# Patient Record
Sex: Female | Born: 1975 | Race: White | Hispanic: No | Marital: Married | Smoking: Never smoker
Health system: Southern US, Community
[De-identification: ages and names within clinical notes are randomized; demographics above are authoritative.]

## PROBLEM LIST (undated history)

## (undated) DIAGNOSIS — R000 Tachycardia, unspecified: Secondary | ICD-10-CM

---

## 1997-05-11 ENCOUNTER — Other Ambulatory Visit: Admission: RE | Admit: 1997-05-11 | Discharge: 1997-05-11 | Payer: Self-pay | Admitting: *Deleted

## 1997-11-28 ENCOUNTER — Other Ambulatory Visit: Admission: RE | Admit: 1997-11-28 | Discharge: 1997-11-28 | Payer: Self-pay | Admitting: *Deleted

## 1997-12-13 ENCOUNTER — Ambulatory Visit (HOSPITAL_COMMUNITY): Admission: RE | Admit: 1997-12-13 | Discharge: 1997-12-13 | Payer: Self-pay | Admitting: General Surgery

## 1999-04-09 ENCOUNTER — Other Ambulatory Visit: Admission: RE | Admit: 1999-04-09 | Discharge: 1999-04-09 | Payer: Self-pay | Admitting: *Deleted

## 2000-05-05 ENCOUNTER — Other Ambulatory Visit: Admission: RE | Admit: 2000-05-05 | Discharge: 2000-05-05 | Payer: Self-pay | Admitting: *Deleted

## 2001-05-05 ENCOUNTER — Other Ambulatory Visit: Admission: RE | Admit: 2001-05-05 | Discharge: 2001-05-05 | Payer: Self-pay | Admitting: *Deleted

## 2002-05-05 ENCOUNTER — Other Ambulatory Visit: Admission: RE | Admit: 2002-05-05 | Discharge: 2002-05-05 | Payer: Self-pay | Admitting: *Deleted

## 2003-05-05 ENCOUNTER — Other Ambulatory Visit: Admission: RE | Admit: 2003-05-05 | Discharge: 2003-05-05 | Payer: Self-pay | Admitting: *Deleted

## 2004-05-08 ENCOUNTER — Other Ambulatory Visit: Admission: RE | Admit: 2004-05-08 | Discharge: 2004-05-08 | Payer: Self-pay | Admitting: *Deleted

## 2004-07-17 ENCOUNTER — Other Ambulatory Visit: Admission: RE | Admit: 2004-07-17 | Discharge: 2004-07-17 | Payer: Self-pay | Admitting: *Deleted

## 2004-12-13 ENCOUNTER — Other Ambulatory Visit: Admission: RE | Admit: 2004-12-13 | Discharge: 2004-12-13 | Payer: Self-pay | Admitting: *Deleted

## 2005-05-08 ENCOUNTER — Encounter: Payer: Self-pay | Admitting: General Surgery

## 2005-07-17 ENCOUNTER — Other Ambulatory Visit: Admission: RE | Admit: 2005-07-17 | Discharge: 2005-07-17 | Payer: Self-pay | Admitting: *Deleted

## 2006-07-28 ENCOUNTER — Other Ambulatory Visit: Admission: RE | Admit: 2006-07-28 | Discharge: 2006-07-28 | Payer: Self-pay | Admitting: *Deleted

## 2006-12-26 ENCOUNTER — Ambulatory Visit (HOSPITAL_COMMUNITY): Admission: RE | Admit: 2006-12-26 | Discharge: 2006-12-26 | Payer: Self-pay | Admitting: Family Medicine

## 2010-05-22 NOTE — Procedures (Signed)
[name]                   ACCOUNT NO.:  [id number]   MEDICAL RECORD NO.:  [id number]          PATIENT TYPE:  OUT   LOCATION:  RESP                          FACILITY:  [facility]   PHYSICIAN:  Madaline Savage, M.D.DATE OF BIRTH:  [birth year]   DATE OF PROCEDURE:  DATE OF DISCHARGE:                                ECHOCARDIOGRAM   DESCRIPTION:  The patient is a 35 year old female with a heart murmur  and the clinical information submitted is new LSB.  No old study  available for comparison.  Technical aspects of the study are adequate.   RESULTS:  Aortic valve:  The aortic valve appears to be trileaflet.  It  shows no evidence of stenosis and no regurgitation is noted.  Mitral valve:  The mitral valve subvalvular apparatus appears normal.  The leaflet motion is normal, leaflet thickness is normal, and I see no  evidence of regurgitation.  Tricuspid valve very poorly seen.  No  obvious tricuspid regurgitation noted.  Pulmonic valve not seen.  Aorta:  Normal aortic root dimension of 3.8.  Left ventricle:  End-diastolic dimension normal at 3.4, end-systolic  dimension 3.1, interventricular septum measures 1.1, posterior wall  measurement is 1.0, left ventricular ejection fraction estimate is  roughly 60%.  Pericardium:  No effusion seen.   IMPRESSION:  This is basically a normal echocardiogram with no evidence  of any specific valvular abnormalities.           ______________________________  Madaline Savage, M.D.     WHG/MEDQ  D:  12/26/2006  T:  12/27/2006  Job:  [id number]   cc:   Corrie Mckusick, M.D.  Fax: [phone]   [facility]

## 2012-02-25 ENCOUNTER — Inpatient Hospital Stay (HOSPITAL_COMMUNITY)
Admission: EM | Admit: 2012-02-25 | Discharge: 2012-02-26 | DRG: 310 | Disposition: A | Discharge status: Home / Self Care | Payer: MEDICAID | Attending: Internal Medicine | Admitting: Internal Medicine

## 2012-02-25 ENCOUNTER — Encounter (HOSPITAL_COMMUNITY): Payer: Self-pay

## 2012-02-25 DIAGNOSIS — R55 Syncope and collapse: Secondary | ICD-10-CM | POA: Diagnosis present

## 2012-02-25 DIAGNOSIS — E559 Vitamin D deficiency, unspecified: Secondary | ICD-10-CM | POA: Diagnosis present

## 2012-02-25 DIAGNOSIS — I498 Other specified cardiac arrhythmias: Principal | ICD-10-CM

## 2012-02-25 DIAGNOSIS — R000 Tachycardia, unspecified: Secondary | ICD-10-CM

## 2012-02-25 HISTORY — DX: Tachycardia, unspecified: R00.0

## 2012-02-25 LAB — CBC WITH DIFFERENTIAL/PLATELET
Basophils Absolute: 0 10*3/uL (ref 0.0–0.1)
Basophils Relative: 0 % (ref 0–1)
Eosinophils Absolute: 0 10*3/uL (ref 0.0–0.7)
Eosinophils Relative: 0 % (ref 0–5)
HCT: 44.5 % (ref 36.0–46.0)
Hemoglobin: 15.4 g/dL — ABNORMAL HIGH (ref 12.0–15.0)
Lymphocytes Relative: 18 % (ref 12–46)
Lymphs Abs: 1.6 10*3/uL (ref 0.7–4.0)
MCH: 32 pg (ref 26.0–34.0)
MCHC: 34.6 g/dL (ref 30.0–36.0)
MCV: 92.5 fL (ref 78.0–100.0)
Monocytes Absolute: 0.3 10*3/uL (ref 0.1–1.0)
Monocytes Relative: 4 % (ref 3–12)
Neutro Abs: 7.3 10*3/uL (ref 1.7–7.7)
Neutrophils Relative %: 79 % — ABNORMAL HIGH (ref 43–77)
Platelets: 322 10*3/uL (ref 150–400)
RBC: 4.81 MIL/uL (ref 3.87–5.11)
RDW: 12.7 % (ref 11.5–15.5)
WBC: 9.3 10*3/uL (ref 4.0–10.5)

## 2012-02-25 LAB — BASIC METABOLIC PANEL
BUN: 15 mg/dL (ref 6–23)
CO2: 24 mEq/L (ref 19–32)
Calcium: 9.9 mg/dL (ref 8.4–10.5)
Chloride: 99 mEq/L (ref 96–112)
Creatinine, Ser: 0.74 mg/dL (ref 0.50–1.10)
GFR calc Af Amer: >90 mL/min (ref >90)
GFR calc non Af Amer: >90 mL/min (ref >90)
Glucose, Bld: 107 mg/dL — ABNORMAL HIGH (ref 70–99)
Potassium: 4 mEq/L (ref 3.5–5.1)
Sodium: 138 mEq/L (ref 135–145)

## 2012-02-25 LAB — URINALYSIS, ROUTINE W REFLEX MICROSCOPIC
Bilirubin Urine: NEGATIVE
Glucose, UA: NEGATIVE mg/dL
Hgb urine dipstick: NEGATIVE
Ketones, ur: 40 mg/dL — AB
Leukocytes, UA: NEGATIVE
pH: 7 (ref 5.0–8.0)

## 2012-02-25 MED ORDER — HEPARIN SODIUM (PORCINE) 5000 UNIT/ML IJ SOLN
5000.0000 [IU] | Freq: Three times a day (TID) | INTRAMUSCULAR | Status: DC
  Administered 2012-02-25 – 2012-02-26 (×2): 5000 [IU] via SUBCUTANEOUS
  Filled 2012-02-25 (×2): qty 1

## 2012-02-25 MED ORDER — ONDANSETRON HCL 4 MG PO TABS: 4.0000 mg | ORAL_TABLET | Freq: Four times a day (QID) | ORAL | Status: DC | PRN

## 2012-02-25 MED ORDER — ONDANSETRON HCL 4 MG/2ML IJ SOLN: 4.0000 mg | Freq: Four times a day (QID) | INTRAMUSCULAR | Status: DC | PRN

## 2012-02-25 MED ORDER — SODIUM CHLORIDE 0.9 % IJ SOLN: 3.0000 mL | Freq: Two times a day (BID) | INTRAMUSCULAR | Status: DC

## 2012-02-25 MED ORDER — POTASSIUM CHLORIDE IN NACL 20-0.9 MEQ/L-% IV SOLN
INTRAVENOUS | Status: DC
  Administered 2012-02-25 – 2012-02-26 (×2): via INTRAVENOUS

## 2012-02-25 NOTE — ED Notes (Signed)
Pt reports has been having "dizzy spells" for a little over 1 month.   Reports around midnight woke up and heart was racing.  Pt went back to sleep and woke up around 2 am to go to the bathroom.  Reports  passed out and hit head on floor in the bathroom.  Reports had blood work on Feb 4th.  Reports was told her vitamin D was low so she was put on vitamin D for 12 weeks.   Pt says has history of tachycardia and was on medication years ago.

## 2012-02-25 NOTE — H&P (Signed)
[facility] History and Physical  Jacqueline [name] AVW:[id number] DOB: 1975-05-13 DOA: 02/25/2012  Referring physician: Dr. Adriana Simas, ER physician. PCP: Colette Ribas, MD    Chief Complaint: Syncope.  HPI: Jacqueline [name] is a 37 y.o. female who presents to the emergency room with a syncopal episode. This occurred 12 hours ago at 2 AM this morning. The patient woke up at midnight yesterday with rapid regular palpitations. These palpitations lasted probably less than 5 minutes and she went back to sleep. When she woke again at 2 AM to go to the bathroom to urinate. When she was in the bathroom she felt lightheaded and then lost consciousness. She think she was unconscious for probably 15 minutes, when she awoke, she did know her surroundings. She did not bite her tongue. There is no history of urinary or fecal incontinence.  Over the last few months since December 2013, and she has been feeling tired and lightheaded for many hours of the day. She has never however lost consciousness before. She also describes palpitations and what she was told was a tachycardia when she was 37 years old. There are no major stressors in her life. In fact she says that her life is very good right now. She was recently diagnosed with vitamin D deficiency by her primary care physician and started on weekly high dose vitamin D supplementation approximately 3 weeks ago. However, her symptoms of fatigue and lightheadedness have been present prior to that. Her periods are regular and not heavy.   Review of Systems: .  Apart from history of present illness, other systems are negative.  Past Medical History  Diagnosis Date  . Tachycardia    History reviewed. No pertinent past surgical history. Social History:  She is divorced and lives alone. She does not smoke cigarettes. She does not drink alcohol. She works as a Tree surgeon. She is fully independent.  Allergies  Allergen Reactions  . Ivp Dye (Iodinated [employer]) Nausea And Vomiting    No family history on file. mother has hypothyroidism.  Prior to Admission medications   Medication Sig Start Date End Date Taking? Authorizing Provider  ibuprofen (ADVIL,MOTRIN) 200 MG tablet Take 200 mg by mouth every 6 (six) hours as needed for pain.   Yes Historical Provider, MD  Vitamin D, Ergocalciferol, (DRISDOL) 50000 UNITS CAPS Take 50,000 Units by mouth every 7 (seven) days.   Yes Historical Provider, MD   Physical Exam: Filed Vitals:   02/25/12 1100 02/25/12 1200 02/25/12 1224 02/25/12 1308  BP: 96/71 107/83 107/83 127/85  Pulse: 96 104 109 109  Temp:      TempSrc:      Resp: 18 16    Height:      Weight:      SpO2: 99% 98% 98% 99%     General:  She looks systemically well. She is a thin lady. There is no goiter.  Eyes: No pallor. No jaundice.  ENT: No abnormalities.  Neck: No lymphadenopathy.  Cardiovascular: Heart sounds are in sinus rhythm. No obvious murmurs or gallop rhythm. She starts to get a tachycardia when she sat up, this is sinus rhythm.  Respiratory: Lung fields are clear.  Abdomen: Soft, nontender. No masses.  Skin: No rash.  Musculoskeletal: No joint problems.  Psychiatric: Appropriate affect. Anxious.  Neurologic: Alert and orientated, no focal neurological signs.  Labs on Admission:  Basic Metabolic Panel:  Recent Labs Lab 02/25/12 0901  NA 138  K 4.0  CL 99  CO2 24  GLUCOSE 107*  BUN 15  CREATININE 0.74  CALCIUM 9.9       CBC:  Recent Labs Lab 02/25/12 0901  WBC 9.3  NEUTROABS 7.3  HGB 15.4*  HCT 44.5  MCV 92.5  PLT 322     EKG: Independently reviewed. Normal sinus rhythm without any acute ST-T wave changes. There is no evidence of Wolff-Parkinson-White syndrome on this electrocardiogram.  Assessment/Plan Principal Problem:   Syncope Active Problems:   Sinus tachycardia   1. Syncope, unclear etiology. I favor postural hypotension. 2. Sinus tachycardia secondary to  #1.  Plan: 1. Admit to telemetry. 2. Monitor for any arrhythmias. 3. Echocardiogram. 4. ACTH stimulation test. 5. Check testosterone, vitamin D and thyroid levels.  Further recommendations will depend on patient's hospital progress.   Code Status: Full code.  Family Communication: Discussed plan with patient and patient's mother at the bedside.  Disposition Plan: Home when medically stable.   Time spent: 45 minutes.  Wilson Singer [facility] Pager [phone].  If 7PM-7AM, please contact night-coverage [email or web address] Password [facility] 02/25/2012, 2:14 PM

## 2012-02-25 NOTE — ED Provider Notes (Signed)
History  This chart was scribed for Donnetta Hutching, MD by Ardeen Jourdain, ED Scribe. This patient was seen in room [facility] and the patient's care was started at 0852.  CSN: [id number]  Arrival date & time 02/25/12  4098   First MD Initiated Contact with Patient 02/25/12 [phone]      Chief Complaint  Patient presents with  . Loss of Consciousness     The history is provided by the patient. No language interpreter was used.    Jacqueline [name] is a 37 y.o. female with a h/o tachycardia who presents to the Emergency Department complaining of sudden onset, intermittent dizziness that began 3 months ago and has been gradually worsening. She reports she stood up to use the bathroom around 2:00 AM last night when she lost consciousness. She states she has not had a similar LOC in years. She reports taking Toperal  in the past for her problems with dizziness and tachycardia, but has not taken the medication in over 10 years. She denies using caffeine recently. She states she had blood work done on 2/4 and was diagnosed with a vitamin D deficiency. She reports being prescribed 50,000 units of vitamin D per week for 12 weeks.    Past Medical History  Diagnosis Date  . Tachycardia     History reviewed. No pertinent past surgical history.  No family history on file.  History  Substance Use Topics  . Smoking status: Never Smoker   . Smokeless tobacco: Not on file  . Alcohol Use: No    No OB history available.   Review of Systems  All other systems reviewed and are negative.  A complete 10 system review of systems was obtained and all systems are negative except as noted in the HPI and PMH.    Allergies  Ivp dye  Home Medications  No current outpatient prescriptions on file.  Triage Vitals: BP 119/90  Pulse 122  Temp(Src) 98.2 F (36.8 C) (Oral)  Resp 20  Ht 4\' 11"  (1.499 m)  Wt 95 lb (43.092 kg)  BMI 19.18 kg/m2  SpO2 100%  LMP 02/11/2012  Physical Exam  Nursing note and  vitals reviewed. Constitutional: She is oriented to person, place, and time. She appears well-developed and well-nourished. No distress.  HENT:  Head: Normocephalic and atraumatic.  Eyes: Conjunctivae and EOM are normal. Pupils are equal, round, and reactive to light.  Neck: Normal range of motion. Neck supple.  Cardiovascular: Normal rate, regular rhythm and normal heart sounds.   Mildly tachycardic at rest  Pulmonary/Chest: Effort normal and breath sounds normal.  Abdominal: Soft. Bowel sounds are normal.  Musculoskeletal: Normal range of motion.  Neurological: She is alert and oriented to person, place, and time.  Skin: Skin is warm and dry. She is not diaphoretic.  Psychiatric: She has a normal mood and affect.    ED Course  Procedures (including critical care time)  DIAGNOSTIC STUDIES: Oxygen Saturation is 100% on room air, normal by my interpretation.    COORDINATION OF CARE:  9:07 AM: Discussed treatment plan which includes an appointment with a cardiologist, head CT, CBC, BMP, UA and pregnancy test with pt at bedside and pt agreed to plan.    Results for orders placed during the hospital encounter of 02/25/12  CBC WITH DIFFERENTIAL      Result Value Range   WBC 9.3  4.0 - 10.5 K/uL   RBC 4.81  3.87 - 5.11 MIL/uL   Hemoglobin 15.4 (*) 12.0 - 15.0  g/dL   HCT 13.0  86.5 - 78.4 %   MCV 92.5  78.0 - 100.0 fL   MCH 32.0  26.0 - 34.0 pg   MCHC 34.6  30.0 - 36.0 g/dL   RDW 69.6  29.5 - 28.4 %   Platelets 322  150 - 400 K/uL   Neutrophils Relative 79 (*) 43 - 77 %   Neutro Abs 7.3  1.7 - 7.7 K/uL   Lymphocytes Relative 18  12 - 46 %   Lymphs Abs 1.6  0.7 - 4.0 K/uL   Monocytes Relative 4  3 - 12 %   Monocytes Absolute 0.3  0.1 - 1.0 K/uL   Eosinophils Relative 0  0 - 5 %   Eosinophils Absolute 0.0  0.0 - 0.7 K/uL   Basophils Relative 0  0 - 1 %   Basophils Absolute 0.0  0.0 - 0.1 K/uL  BASIC METABOLIC PANEL      Result Value Range   Sodium 138  135 - 145 mEq/L    Potassium 4.0  3.5 - 5.1 mEq/L   Chloride 99  96 - 112 mEq/L   CO2 24  19 - 32 mEq/L   Glucose, Bld 107 (*) 70 - 99 mg/dL   BUN 15  6 - 23 mg/dL   Creatinine, Ser 1.32  0.50 - 1.10 mg/dL   Calcium 9.9  8.4 - 44.0 mg/dL   GFR calc non Af Amer >90  >90 mL/min   GFR calc Af Amer >90  >90 mL/min  URINALYSIS, ROUTINE W REFLEX MICROSCOPIC      Result Value Range   Color, Urine YELLOW  YELLOW   APPearance CLEAR  CLEAR   Specific Gravity, Urine 1.015  1.005 - 1.030   pH 7.0  5.0 - 8.0   Glucose, UA NEGATIVE  NEGATIVE mg/dL   Hgb urine dipstick NEGATIVE  NEGATIVE   Bilirubin Urine NEGATIVE  NEGATIVE   Ketones, ur 40 (*) NEGATIVE mg/dL   Protein, ur NEGATIVE  NEGATIVE mg/dL   Urobilinogen, UA 0.2  0.0 - 1.0 mg/dL   Nitrite NEGATIVE  NEGATIVE   Leukocytes, UA NEGATIVE  NEGATIVE  PREGNANCY, URINE      Result Value Range   Preg Test, Ur NEGATIVE  NEGATIVE    No results found.   No diagnosis found.   Date: 02/25/2012  Rate: 101  Rhythm: sinus tachycardia  QRS Axis: left  Intervals: normal  ST/T Wave abnormalities: normal  Conduction Disutrbances:none  Narrative Interpretation:   Old EKG Reviewed: none available   MDM  Uncertain etiology of syncope, dizziness, tachycardia syndrome.  Consult with hospitalist       I personally performed the services described in this documentation, which was scribed in my presence. The recorded information has been reviewed and is accurate.    Donnetta Hutching, MD 02/25/12 [phone]

## 2012-02-26 DIAGNOSIS — R55 Syncope and collapse: Secondary | ICD-10-CM

## 2012-02-26 LAB — CBC
HCT: 38.7 % (ref 36.0–46.0)
MCH: 31.9 pg (ref 26.0–34.0)
MCV: 95.1 fL (ref 78.0–100.0)
RBC: 4.07 MIL/uL (ref 3.87–5.11)
WBC: 6.8 10*3/uL (ref 4.0–10.5)

## 2012-02-26 LAB — COMPREHENSIVE METABOLIC PANEL
AST: 13 U/L (ref 0–37)
Albumin: 3.7 g/dL (ref 3.5–5.2)
BUN: 12 mg/dL (ref 6–23)
Chloride: 110 mEq/L (ref 96–112)
Creatinine, Ser: 0.7 mg/dL (ref 0.50–1.10)
Potassium: 5.1 mEq/L (ref 3.5–5.1)
Total Protein: 6 g/dL (ref 6.0–8.3)

## 2012-02-26 MED ORDER — COSYNTROPIN 0.25 MG IJ SOLR
0.2500 mg | Freq: Once | INTRAMUSCULAR | Status: AC
  Administered 2012-02-26: 0.25 mg via INTRAVENOUS
  Filled 2012-02-26: qty 0.25

## 2012-02-26 NOTE — Discharge Summary (Signed)
Physician Discharge Summary  Jacqueline [name] WNU:[id number] DOB: [birth year] DOA: 02/25/2012  PCP: Colette Ribas, MD  Admit date: 02/25/2012 Discharge date: 02/26/2012  Time spent: Greater than 30 minutes  Recommendations for Outpatient Follow-up:  1. Follow with cardiology tomorrow. 2. Follow up primary care physician in Jacqueline next couple of weeks. Jacqueline [name] may need referral to endocrinology.   Discharge Diagnoses:  1. Orthostatic sinus tachycardia. Unclear etiology.? Adrenal insufficiency. 2. Syncopal episode secondary to #1.    Discharge Condition: Stable.  Diet recommendation: Regular.  Filed Weights   02/25/12 0847 02/25/12 1559  Weight: 95 lb (43.092 kg) 95 lb (43.092 kg)    History of present illness:  This 37 year old [name] presented to Jacqu[facility] hospital with symptoms of syncopal episode. Please see initial history as outlined below: HPI: Jacqueline [name] is a 37 y.o. female who presents to Jacqueline emergency room with a syncopal episode. This occurred 12 hours ago at 2 AM this morning. Jacqueline [name] woke up at midnight yesterday with rapid regular palpitations. These palpitations lasted probably less than 5 minutes and Jacqueline [name] went back to sleep. When Jacqueline [name] woke again at 2 AM to go to Jacqueline bathroom to urinate. When Jacqueline [name] was in Jacqueline bathroom Jacqueline [name] felt lightheaded and then lost consciousness. Jacqueline [name] think Jacqueline [name] was unconscious for probably 15 minutes, when Jacqueline [name] awoke, Jacqueline [name] did know her surroundings. Jacqueline [name] did not bite her tongue. There is no history of urinary or fecal incontinence.  Over Jacqueline last few months since December 2013, and Jacqueline [name] has been feeling tired and lightheaded for many hours of Jacqueline day. Jacqueline [name] has never however lost consciousness before. Jacqueline [name] also describes palpitations and what Jacqueline [name] was told was a tachycardia when Jacqueline [name] was 37 years old.  There are no major stressors in her life. In fact Jacqueline [name] says that her life is very good right now. Jacqueline [name] was recently diagnosed with vitamin D deficiency by her primary care physician and  started on weekly high dose vitamin D supplementation approximately 3 weeks ago. However, her symptoms of fatigue and lightheadedness have been present prior to that.  Her periods are regular and not heavy.  Hospital Course:  [name] was admitted and started on intravenous fluids. Her history and physical findings point towards orthostatic changes and possibly adrenal insufficiency. Her sodium was normal. However when Jacqueline [name] presented her hemoglobin was elevated, indicating dehydration. Jacqueline following morning after hydration hemoglobin returned to normal levels. Whenever Jacqueline [name] sat up or stood up, Jacqueline [name] has a sinus tachycardia. This is associated with symptoms of dizziness. Jacqueline [name] underwent echocardiogram, result is pending. Jacqueline [name] underwent an ACTH stimulation test, result is pending. Jacqueline [name] is due to see cardiology tomorrow. Jacqueline [name] may require a tilt test. If cardiological workup is negative, I would suggest her primary care physician refer her to an endocrinologist.  Procedures:  Echocardiogram, results pending.  Consultations:  None.  Discharge Exam: Filed Vitals:   02/26/12 0511 02/26/12 1434 02/26/12 1437 02/26/12 1439  BP: 99/75 130/84 122/75 138/89  Pulse: 86 90 106 119  Temp:  98 F (36.7 C)    TempSrc:      Resp:  16    Height:      Weight:      SpO2:  100%      General: Jacqueline [name] looks systemically well. Cardiovascular: Heart sounds are present and in sinus rhythm. No murmurs or gallops. Respiratory: Lung fields are clear. Jacqueline [name] is alert and orientated without any focal neurological signs.  Discharge Instructions  Discharge Orders   Future Orders Complete  By Expires     Diet - low sodium heart healthy  As directed     Increase activity slowly  As directed         Medication List    STOP taking these medications       Vitamin D (Ergocalciferol) 50000 UNITS Caps  Commonly known as:  DRISDOL      TAKE these medications       ibuprofen 200 MG tablet  Commonly known as:  ADVIL,MOTRIN   Take 200 mg by mouth every 6 (six) hours as needed for pain.          Jacqueline results of significant diagnostics from this hospitalization (including imaging, microbiology, ancillary and laboratory) are listed below for reference.    Significant Diagnostic Studies: No results found.      Labs: Basic Metabolic Panel:  Recent Labs Lab 02/25/12 0901 02/26/12 0511  NA 138 141  K 4.0 5.1  CL 99 110  CO2 24 25  GLUCOSE 107* 90  BUN 15 12  CREATININE 0.74 0.70  CALCIUM 9.9 8.8   Liver Function Tests:  Recent Labs Lab 02/26/12 0511  AST 13  ALT 12  ALKPHOS 53  BILITOT 0.6  PROT 6.0  ALBUMIN 3.7     CBC:  Recent Labs Lab 02/25/12 0901 02/26/12 0511  WBC 9.3 6.8  NEUTROABS 7.3  --   HGB 15.4* 13.0  HCT 44.5 38.7  MCV 92.5 95.1  PLT 322 259         Signed:  Kamoni Gentles C  [facility] 02/26/2012, 3:20 PM

## 2012-02-26 NOTE — Progress Notes (Signed)
[facility] SURGICAL UNIT [address] [id number] [place] [address] Phone: [phone] Fax: [phone]  February 26, 2012  Patient: Jacqueline [name]  Date of Birth: January 11, 1975  Date of Visit: 02/25/2012    To Whom It May Concern:  Jacqueline [name] was seen and treated in our hospital on 02/25/2012 and discharged on 02/26/2012. Jacqueline [name] can return to work on 03/04/2012. It would be recommended that this patient have at least two half hour breaks during her workday.  Sincerely,

## 2012-02-26 NOTE — Plan of Care (Signed)
Problem: Phase II Progression Outcomes Goal: Progress activity as tolerated unless otherwise ordered Outcome: Completed/Met Date Met:  02/26/12 02/26/12 1334 Patient ambulates in room with assist/supervision. Mother at bedside assisting with care, instructed to call as needed and if any dizziness. Stated understood.

## 2012-02-26 NOTE — Progress Notes (Signed)
02/26/12 1706 Reviewed discharge instructions with patient, mother at bedside. Given copy of instructions, medication list, doctors note for work, and follow-up appointment information via teachback method. Follow-up appointment scheduled with Dr Eden Emms tomorrow afternoon, pt states will attend. Given education sheets regarding ACTH stimulation test earlier this afternoon. Also given syncope and tachycardia education sheets, and discussed when to call MD and seek medical attention. Instructed to increase activity as tolerated as directed and change positions slowly to reduce dizziness. Stated understood. IV site d/c'd and within normal limits, telemetry d/c'd. Notified CMT of telemetry d/c for discharge. Pt denied pain or other discomfort. 2D echo and ACTH stimulation test completed this afternoon prior to discharge as instructed per Dr Karilyn Cota. Pt left floor in stable condition via w/c accompanied by nurse tech. Earnstine Regal, RN

## 2012-02-26 NOTE — Progress Notes (Signed)
Nutrition Brief Note:  Pt is 37 yo female who presented to the ED with a syncopal episode and rapid regular palpitations. She has a history of tachycardia and has recently experienced fatigue and lightheadedness.  Other than the present illness, all other systems are negative.   Pt indicated recent weight loss and decreased appetite (MST=2) on malnutrition screening tool.    RD spoke with pt about weight history and intake. Pt is only 4'11' and reports her usual body weight ranges from 95-100 lbs, which is consistent with her ideal body weight (100 lbs). Pt reports she has always been small.  Pt is thin but does not appear malnourished.  Pt's BMI is WNL.    Pt reports a slight decreased appetite but admits that periodic decreased intake is not unusual.  Pt reports decreased intake is likely d/t long work days (10-12 hr shifts) with minimal time to eat. Pt ate a full breakfast (eggs, bacon, biscuit) and does not think she has a problem getting adequate intake.  Pt's mother was willing to provide pt with any other food items she would like.  Pt's main concern was getting enough to eat during a busy day at work. Pt did not indicate a need for any oral nutrition supplements while admitted. Pt was not concerned for her nutrition status at this time.   No nutrition interventions necessary at this time.  RD will continue to monitor.    Wt Readings from Last 10 Encounters:  02/25/12 95 lb (43.092 kg)   Body mass index is 19.18 kg/(m^2). - WNL   Royann Shivers MS,RD,LDN,CSG Office: [phone] Pager: [phone]

## 2012-02-26 NOTE — Care Management Note (Unsigned)
    Page 1 of 1   02/26/2012     11:15:48 AM   CARE MANAGEMENT NOTE 02/26/2012  Patient:  Jacqueline [name]   Account Number:  [id number]  Date Initiated:  02/26/2012  Documentation initiated by:  Rosemary Holms  Subjective/Objective Assessment:   Pt admitted after a syncopal episode. Lives alone. Mother with pt at bedside. Mother requested to speak w/ Lubertha Basque. Pt may need med assistance which MATCH program explained to both pt and mother.     Action/Plan:   Anticipated DC Date:  02/27/2012   Anticipated DC Plan:  HOME/SELF CARE  In-house referral  Financial Counselor      DC Planning Services  CM consult      Choice offered to / List presented to:             Status of service:  In process, will continue to follow Medicare Important Message given?   (If response is "NO", the following Medicare IM given date fields will be blank) Date Medicare IM given:   Date Additional Medicare IM given:    Discharge Disposition:    Per UR Regulation:    If discussed at Long Length of Stay Meetings, dates discussed:    Comments:  02/26/12 Rosemary Holms RN BNS CM

## 2012-02-26 NOTE — Progress Notes (Signed)
*  PRELIMINARY RESULTS* Echocardiogram 2D Echocardiogram has been performed.  Jacqueline [name] 02/26/2012, 3:00 PM

## 2012-02-26 NOTE — Progress Notes (Signed)
UR Chart Review Completed  

## 2012-02-27 ENCOUNTER — Encounter: Payer: Self-pay | Admitting: *Deleted

## 2012-02-27 ENCOUNTER — Encounter: Payer: Self-pay | Admitting: Cardiovascular Disease

## 2012-02-27 ENCOUNTER — Other Ambulatory Visit: Payer: Self-pay | Admitting: *Deleted

## 2012-02-28 LAB — ACTH STIMULATION, 3 TIME POINTS: Cortisol, 60 Min: 25.8 ug/dL (ref >20)

## 2012-03-09 ENCOUNTER — Encounter: Payer: Self-pay | Admitting: Internal Medicine

## 2012-03-09 ENCOUNTER — Ambulatory Visit (INDEPENDENT_AMBULATORY_CARE_PROVIDER_SITE_OTHER): Payer: Self-pay | Admitting: Internal Medicine

## 2012-03-09 ENCOUNTER — Encounter: Payer: Self-pay | Admitting: *Deleted

## 2012-03-09 ENCOUNTER — Other Ambulatory Visit: Payer: Self-pay | Admitting: Cardiovascular Disease

## 2012-03-09 VITALS — BP 110/60 | HR 99 | Ht 59.0 in | Wt 95.0 lb

## 2012-03-09 DIAGNOSIS — R55 Syncope and collapse: Secondary | ICD-10-CM

## 2012-03-09 NOTE — Progress Notes (Signed)
HPI [name] is referred today by Dr. Eden Emms for evaluation of unexplained syncope. She is an otherwise healthy 37 year old woman who has seen Dr. Juanda Chance remotely. She was noted to have "tachycardia" and was placed on a beta blocker. Her history dates back to childhood. She notes that as a young girl, she would have dizzy spells and occasionally pass out. As a teenager, but passing out spells recurred and have been present for many years. Most recently, she was using the bathroom and passed out, falling to the ground. She was subsequently taken to the emergency room and had a echocardiogram which was reportedly normal. She denies tongue biting, or loss of bowel or bladder continence when she passes out. Prior to the episodes, she will experience nausea, diaphoresis, and lose her hearing transiently. On awakening, she feels weak, tired, and has persistent diaphoresis. Following these episodes, she will fill poorly for many hours to up to a day. She denies caffeine or alcohol abuse. Her boyfriend who is with her today notes that she does not eat as much as she should and typically misses meals. Allergies  Allergen Reactions  . Ivp Dye (Iodinated Diagnostic Agents) Nausea And Vomiting     Current Outpatient Prescriptions  Medication Sig Dispense Refill  . ibuprofen (ADVIL,MOTRIN) 200 MG tablet Take 200 mg by mouth every 6 (six) hours as needed for pain.       No current facility-administered medications for this visit.     Past Medical History  Diagnosis Date  . Tachycardia     ROS:   All systems reviewed and negative except as noted in the HPI.   History reviewed. No pertinent past surgical history.   No family history on file.   History   Social History  . Marital Status: Divorced    Spouse Name: N/A    Number of Children: N/A  . Years of Education: N/A   Occupational History  . Not on file.   Social History Main Topics  . Smoking status: Never Smoker   . Smokeless  tobacco: Not on file  . Alcohol Use: No  . Drug Use: No  . Sexually Active: Yes    Birth Control/ Protection: None   Other Topics Concern  . Not on file   Social History Narrative  . No narrative on file     BP 110/60  Pulse 99  Ht 4\' 11"  (1.499 m)  Wt 95 lb (43.092 kg)  BMI 19.18 kg/m2  SpO2 93%  LMP 02/11/2012  Physical Exam:  Well appearing 37 year old woman,NAD HEENT: Unremarkable Neck:  No JVD, no thyromegally Lungs:  Clear with no wheezes, rales, or rhonchi. HEART:  Regular rate rhythm, no murmurs, no rubs, no clicks Abd:  soft, positive bowel sounds, no organomegally, no rebound, no guarding Ext:  2 plus pulses, no edema, no cyanosis, no clubbing Skin:  No rashes no nodules Neuro:  CN II through XII intact, motor grossly intact  EKG - normal sinus rhythm. I no one ECG which demonstrates mild ST elevation in lead V1 and V2  Assess/Plan:

## 2012-03-09 NOTE — Patient Instructions (Addendum)
Your physician recommends that you schedule a follow-up appointment in: WITH [facility] IN 8-10 WEEKS  YOUR PHYSICIAN RECOMMENDS THE REMOVAL OF YOUR EVENT MONITOR TODAY  WRITTEN INSTRUCTIONS GIVEN FROM [facility] ENCLOSED PER SALT DIET/EXERCISE/REST REGIMEN   A WORK NOTE HAS BEEN PROVIDED TO ALLOW A 15 MINUTE BREAK FOR EVERY THREE HOURS WORKED

## 2012-03-09 NOTE — Assessment & Plan Note (Signed)
The patient's recurrent episodes of syncope are very strongly consistent with autonomic dysfunction and a neurally mediated mechanism. She has worn a cardiac monitor which demonstrated no arrhythmias. Her left ventricular function is normal by echo. The patient abnormal EKG might raise the suspicion for a Brugada pattern, but her history is certainly not consistent with Brugada syndrome as she has passed out over 20 times in her life, and does not experience palpitations when she passes out. She has never had documented ventricular tachycardia. Today we discussed the mechanism of autonomic dysfunction and the importance of increased salt, fluid, and horizontal posture when an episode occurs. In addition we discussed the importance of adequate sleep and avoidance of caffeine and alcohol intake. I plan to see her back in 8-10 weeks.

## 2012-04-02 LAB — VITAMIN D 1,25 DIHYDROXY
Vitamin D 1, 25 (OH)2 Total: 73 pg/mL — ABNORMAL HIGH (ref 18–72)
Vitamin D2 1, 25 (OH)2: 41 pg/mL

## 2012-04-02 LAB — SEX HORMONE BINDING GLOBULIN: Sex Hormone Binding: 114 nmol/L (ref 18–114)

## 2012-04-02 LAB — TESTOSTERONE: Testosterone: 44 ng/dL (ref 10–70)

## 2012-04-02 LAB — T4, FREE: Free T4: 1.14 ng/dL (ref 0.80–1.80)

## 2012-04-02 LAB — TSH: TSH: 2.452 u[IU]/mL (ref 0.350–4.500)

## 2012-04-02 LAB — T3, FREE: T3, Free: 3.1 pg/mL (ref 2.3–4.2)

## 2012-05-19 ENCOUNTER — Ambulatory Visit: Payer: Self-pay | Admitting: Internal Medicine

## 2012-11-12 ENCOUNTER — Other Ambulatory Visit: Payer: Self-pay

## 2013-04-30 ENCOUNTER — Other Ambulatory Visit (HOSPITAL_COMMUNITY): Payer: Self-pay | Admitting: Family Medicine

## 2013-04-30 DIAGNOSIS — H539 Unspecified visual disturbance: Secondary | ICD-10-CM

## 2013-04-30 DIAGNOSIS — G629 Polyneuropathy, unspecified: Secondary | ICD-10-CM

## 2013-04-30 DIAGNOSIS — G589 Mononeuropathy, unspecified: Secondary | ICD-10-CM

## 2013-05-01 ENCOUNTER — Other Ambulatory Visit: Payer: Self-pay | Admitting: Family Medicine

## 2013-05-01 DIAGNOSIS — G629 Polyneuropathy, unspecified: Secondary | ICD-10-CM

## 2013-05-01 DIAGNOSIS — H539 Unspecified visual disturbance: Secondary | ICD-10-CM

## 2013-05-01 DIAGNOSIS — G589 Mononeuropathy, unspecified: Secondary | ICD-10-CM

## 2013-05-04 ENCOUNTER — Ambulatory Visit (HOSPITAL_COMMUNITY): Payer: Self-pay

## 2013-05-10 ENCOUNTER — Ambulatory Visit
Admission: RE | Admit: 2013-05-10 | Discharge: 2013-05-10 | Disposition: A | Discharge status: Home / Self Care | Payer: No Typology Code available for payment source | Source: Ambulatory Visit | Attending: Family Medicine | Admitting: Family Medicine

## 2013-05-10 DIAGNOSIS — H539 Unspecified visual disturbance: Secondary | ICD-10-CM

## 2013-05-10 DIAGNOSIS — G629 Polyneuropathy, unspecified: Secondary | ICD-10-CM

## 2013-05-10 DIAGNOSIS — G589 Mononeuropathy, unspecified: Secondary | ICD-10-CM

## 2013-05-10 MED ORDER — GADOBENATE DIMEGLUMINE 529 MG/ML IV SOLN
8.0000 mL | Freq: Once | INTRAVENOUS | Status: AC | PRN
  Administered 2013-05-10: 8 mL via INTRAVENOUS

## 2013-09-08 ENCOUNTER — Encounter: Payer: Self-pay | Admitting: Internal Medicine

## 2013-09-23 ENCOUNTER — Encounter (INDEPENDENT_AMBULATORY_CARE_PROVIDER_SITE_OTHER): Payer: Self-pay | Admitting: *Deleted

## 2013-10-11 ENCOUNTER — Ambulatory Visit: Payer: Self-pay | Admitting: Gastroenterology

## 2013-10-20 ENCOUNTER — Ambulatory Visit (INDEPENDENT_AMBULATORY_CARE_PROVIDER_SITE_OTHER): Payer: Self-pay | Admitting: Internal Medicine

## 2013-10-25 ENCOUNTER — Encounter: Payer: Self-pay | Admitting: Cardiovascular Disease

## 2013-10-25 ENCOUNTER — Ambulatory Visit (INDEPENDENT_AMBULATORY_CARE_PROVIDER_SITE_OTHER): Payer: BC Managed Care – PPO | Admitting: Cardiovascular Disease

## 2013-10-25 VITALS — BP 110/90 | HR 108 | Ht 60.0 in | Wt 99.0 lb

## 2013-10-25 DIAGNOSIS — R000 Tachycardia, unspecified: Secondary | ICD-10-CM

## 2013-10-25 DIAGNOSIS — G909 Disorder of the autonomic nervous system, unspecified: Secondary | ICD-10-CM

## 2013-10-25 DIAGNOSIS — R002 Palpitations: Secondary | ICD-10-CM

## 2013-10-25 DIAGNOSIS — R55 Syncope and collapse: Secondary | ICD-10-CM

## 2013-10-25 MED ORDER — METOPROLOL TARTRATE 25 MG PO TABS: 12.5000 mg | ORAL_TABLET | Freq: Two times a day (BID) | ORAL | Status: DC

## 2013-10-25 NOTE — Progress Notes (Signed)
Patient ID: Jacqueline GilbertSue Moore, female   DOB: September 04, 1975, 38 y.o.   MRN: [id number]      SUBJECTIVE: The patient is a 38 year old woman who has seen Dr. Lewayne BuntingGregg Taylor in the past. She has a history of tachycardia and had been on a beta blocker in the past. She also has a history of recurrent syncope consistent with autonomic dysfunction. Prior echocardiography demonstrated normal left ventricular systolic function (02/2012) and a previously worn cardiac monitor did not demonstrate any arrhythmias. It has been noted that ECG is in the past may have raised the suspicion for a Brugada pattern, but history has not been consistent with a Brugada syndrome with no experience of palpitations when she passes out. She has never had documented ventricular tachycardia. In the past, it has been recommended that she increase her consumption of salt and fluids.  She has had syncopal episodes since the age of 89. 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 She has had bilateral leg weakness after standing for long periods of time at work. 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 She does not consume alcohol and has stopped caffeine intake and only drinks water. She has moments of forgetfulness and has had blurry vision at times, although she said she saw an ophthalmologist who said her vision was normal. She has a lot of stress from work. She has had palpitations which have awoken her from sleep. She said that her body does not like being in the upright position and she feels much better when she is lying down. She has also had gastrointestinal issues and can no several weeks without a bowel movement. She reports a battery of blood tests which were done and were all normal which reportedly tested for lupus, renal  dysfunction, anemia, vitamin D levels, and thyroid disease.  She reports blood pressure fluctuations with readings of 96/58 and as high as 140/90.  She denies leg swelling, orthopnea, and paroxysmal nocturnal dyspnea. She does not report any chest pain. She has not had complete loss of consciousness in approximately 2 years.  ECG performed in the office today demonstrates normal sinus rhythm with rSR prime pattern in leads V1 and V2 with a nonspecific ST segment abnormality. There is no coving of the descending limb of the QRS or ST segment elevation to suggest a Brugada pattern.   Review of Systems: As per "subjective", otherwise negative.  Allergies  Allergen Reactions  . Ivp Dye [Iodinated Diagnostic Agents] Nausea And Vomiting    Current Outpatient Prescriptions  Medication Sig Dispense Refill  . ibuprofen (ADVIL,MOTRIN) 200 MG tablet Take 200 mg by mouth every 6 (six) hours as needed for pain.       No current facility-administered medications for this visit.    Past Medical History  Diagnosis Date  . Tachycardia     No past surgical history on file.  History   Social History  . Marital Status: Divorced    Spouse Name: N/A    Number of Children: N/A  . Years of Education: N/A   Occupational History  . Not on file.   Social History Main Topics  . Smoking status: Never Smoker   . Smokeless tobacco: Not on file  . Alcohol Use: No  . Drug Use: No  .  Sexual Activity: Yes    Birth Control/ Protection: None   Other Topics Concern  . Not on file   Social History Narrative  . No narrative on file    BP 110/90  Pulse 108 Weight 99 lb (44.906 kg) Height 5' (1.524 m)   Repeat HR when sitting: 70 bpm range Standing: 110 bpm range  PHYSICAL EXAM General: NAD HEENT: Normal. Neck: No JVD, no thyromegaly. Lungs: Clear to auscultation bilaterally with normal respiratory effort. CV: Nondisplaced PMI.  Regular rate and rhythm, normal S1/S2, no S3/S4, no murmur. No  pretibial or periankle edema.  No carotid bruit.  Normal pedal pulses.  Abdomen: Soft, nontender, no hepatosplenomegaly, no distention.  Neurologic: Alert and oriented x 3.  Psych: Normal affect. Skin: Normal. Musculoskeletal: Normal range of motion, no gross deformities. Extremities: No clubbing or cyanosis.   ECG: Most recent ECG reviewed.      ASSESSMENT AND PLAN: 1. Autonomic dysfunction/recurrent syncope: This is been a debilitating disease for her since childhood. She has taken appropriate measures and stopped alcohol consumption as well as caffeine intake and primarily drinks water. She believes she consumes an adequate amount of sodium as she eats out significantly. Autonomic dysfunction can be very difficult to treat particularly with blood pressure swings with systolic readings in the 90-140 mmHg range. I will attempt low-dose metoprolol 12.5 mg twice daily to see if this helps to attenuate her tachycardia and palpitations, without significantly lowering her blood pressure. I have also recommended that she take scheduled breaks at work of 30 minutes and two separate 15 minute breaks as well, to minimize fatigue and symptoms of orthostasis. 2. Tachycardia: HR elevates quickly when going from the sitting to upright position. As stated previously, I will initiate low-dose metoprolol 12.5 mg twice daily. I will also obtain a one-week event monitor to see if this sufficiently controls heart rate and to see if there are any other potential tachyarrhythmias.  Dispo: f/u 4-6 weeks.  Time spent: 40 minutes, of which >50% was spent explaining the signs, symptoms, pathophysiology, and treatment of autonomic dysfunction.  Prentice DockerSuresh Koneswaran, M.D., F.A.C.C.

## 2013-10-25 NOTE — Patient Instructions (Signed)
Your physician recommends that you schedule a follow-up appointment in: 4-6 weeks     Your physician has recommended you make the following change in your medication:     START Metoprolol 12.5 mg twice a day   Your physician has recommended that you wear an event monitor for 7 days. Event monitors are medical devices that record the heart's electrical activity. Doctors most often us these monitors to diagnose arrhythmias. Arrhythmias are problems with the speed or rhythm of the heartbeat. The monitor is a small, portable device. You can wear one while you do your normal daily activities. This is usually used to diagnose what is causing palpitations/syncope (passing out).         Thank you for choosing [facility] !

## 2013-11-01 ENCOUNTER — Ambulatory Visit (INDEPENDENT_AMBULATORY_CARE_PROVIDER_SITE_OTHER): Payer: Self-pay | Admitting: Internal Medicine

## 2013-11-02 ENCOUNTER — Telehealth: Payer: Self-pay | Admitting: *Deleted

## 2013-11-02 NOTE — Telephone Encounter (Signed)
EOS report received in Dr. Junius ArgyleKoneswaran's folder

## 2013-11-08 ENCOUNTER — Other Ambulatory Visit: Payer: Self-pay | Admitting: *Deleted

## 2013-11-08 DIAGNOSIS — R000 Tachycardia, unspecified: Secondary | ICD-10-CM

## 2013-11-08 DIAGNOSIS — R55 Syncope and collapse: Secondary | ICD-10-CM

## 2013-11-22 ENCOUNTER — Encounter (INDEPENDENT_AMBULATORY_CARE_PROVIDER_SITE_OTHER): Payer: Self-pay | Admitting: Internal Medicine

## 2013-11-22 ENCOUNTER — Ambulatory Visit (INDEPENDENT_AMBULATORY_CARE_PROVIDER_SITE_OTHER): Payer: BC Managed Care – PPO | Admitting: Internal Medicine

## 2013-11-22 VITALS — BP 98/70 | HR 76 | Temp 98.7°F | Ht 59.5 in | Wt 98.1 lb

## 2013-11-22 DIAGNOSIS — K5900 Constipation, unspecified: Secondary | ICD-10-CM

## 2013-11-22 NOTE — Patient Instructions (Signed)
OV in 8 weeks. Try LInzess once a day. Stool diary.

## 2013-11-22 NOTE — Progress Notes (Addendum)
   Subjective:    Patient ID: Jacqueline [name], female    DOB: [birth year], 38 y.o.   MRN: [id number]  HPI Referred to our office by Dr. Phillips OdorGolding for chronic constipation. She has been constipated for "along time".  She says in September she saw DR.  Koberlein. She had not had a BM in almost. Given moviprep and she had a large BM. She started taking the Miralax on a daily basis. Now she is having a BM about every 3rd days.  BMs are formed. Sometimes she will have diarrhea if she takes the Miralax on a daily basis.  She says she is better.  Appetite is good. No weight loss.  She has tried fiber. She has cut out caffeine.  No melena or BRRB. Hx of tachycardia.     Review of Systems  Married. 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 No children.   Past Medical History  Diagnosis Date  . Tachycardia     No past surgical history on file.  Allergies  Allergen Reactions  . Ivp Dye [Iodinated Diagnostic Agents] Nausea And Vomiting  . Prednisone Other (See Comments)    Behavior changes     Current Outpatient Prescriptions on File Prior to Visit  Medication Sig Dispense Refill  . ibuprofen (ADVIL,MOTRIN) 200 MG tablet Take 200 mg by mouth every 6 (six) hours as needed for pain.    . metoprolol tartrate (LOPRESSOR) 25 MG tablet Take 0.5 tablets (12.5 mg total) by mouth 2 (two) times daily. 90 tablet 3   No current facility-administered medications on file prior to visit.        Objective:   Physical Exam  Filed Vitals:   11/22/13 1514  Height: 4' 11.5" (1.511 m)  Weight: 98 lb 1.6 oz (44.498 kg)  Alert and oriented. Skin warm and dry. Oral mucosa is moist.   . Sclera anicteric, conjunctivae is pink. Thyroid not enlarged. No cervical lymphadenopathy. Lungs clear. Heart regular rate and rhythm.  Abdomen is soft. Bowel sounds are positive. No hepatomegaly. No abdominal masses felt. No tenderness.  No edema to lower extremities. Stool brown an guaiac negative.         Assessment & Plan:  Chronic  constipation. Better since starting Miralax. Has samples of Linzess at home (145mcg). Start the Linzess and see if you like better than the Miralax. OV in 8 weeks with stool diary.

## 2013-12-06 ENCOUNTER — Ambulatory Visit: Payer: BC Managed Care – PPO | Admitting: Cardiovascular Disease

## 2013-12-17 ENCOUNTER — Encounter: Payer: Self-pay | Admitting: Cardiovascular Disease

## 2013-12-17 ENCOUNTER — Ambulatory Visit (INDEPENDENT_AMBULATORY_CARE_PROVIDER_SITE_OTHER): Payer: BC Managed Care – PPO | Admitting: Cardiovascular Disease

## 2013-12-17 VITALS — BP 98/72 | HR 83 | Ht 59.0 in | Wt 96.0 lb

## 2013-12-17 DIAGNOSIS — G909 Disorder of the autonomic nervous system, unspecified: Secondary | ICD-10-CM

## 2013-12-17 DIAGNOSIS — H53143 Visual discomfort, bilateral: Secondary | ICD-10-CM

## 2013-12-17 DIAGNOSIS — H5319 Other subjective visual disturbances: Secondary | ICD-10-CM

## 2013-12-17 DIAGNOSIS — R55 Syncope and collapse: Secondary | ICD-10-CM

## 2013-12-17 DIAGNOSIS — R002 Palpitations: Secondary | ICD-10-CM

## 2013-12-17 DIAGNOSIS — R000 Tachycardia, unspecified: Secondary | ICD-10-CM

## 2013-12-17 NOTE — Patient Instructions (Signed)

## 2013-12-17 NOTE — Progress Notes (Signed)
Patient ID: Jacqueline [name], female   DOB: Nov 28, 1975, 38 y.o.   MRN: [id number]      SUBJECTIVE: The patient presents for follow-up for tachycardia and palpitations and recurrent syncope with autonomic dysfunction. At her last visit, I initiated metoprolol 12.5 mg twice daily to see if this would alleviate her symptoms. Event monitoring demonstrated normal sinus rhythm with no evidence of tachycardia. Her symptoms correlated with normal sinus rhythm. She is feeling better and feels that her symptoms are under better control. She also sleeps better at night. She has difficulty driving home at night on occasion due to bright headlights, as her pupillary constrictive ability is compromised due to her autonomic dysfunction. She wears prescription sunglasses while driving during the day. She occasionally gets slightly lightheaded when standing up too quickly. She has not had any syncopal episodes.   Review of Systems: As per "subjective", otherwise negative.  Allergies  Allergen Reactions  . Ivp Dye [Iodinated Diagnostic Agents] Nausea And Vomiting  . Prednisone Other (See Comments)    Behavior changes     Current Outpatient Prescriptions  Medication Sig Dispense Refill  . ibuprofen (ADVIL,MOTRIN) 200 MG tablet Take 200 mg by mouth every 6 (six) hours as needed for pain.    . metoprolol tartrate (LOPRESSOR) 25 MG tablet Take 0.5 tablets (12.5 mg total) by mouth 2 (two) times daily. 90 tablet 3  . Prenatal Multivit-Min-Fe-FA (PRENATAL VITAMINS PO) Take by mouth.     No current facility-administered medications for this visit.    Past Medical History  Diagnosis Date  . Tachycardia     No past surgical history on file.  History   Social History  . Marital Status: Married    Spouse Name: N/A    Number of Children: N/A  . Years of Education: N/A   Occupational History  . Not on file.   Social History Main Topics  . Smoking status: Never Smoker   . Smokeless tobacco: Never Used  .  Alcohol Use: No  . Drug Use: No  . Sexual Activity: Yes    Birth Control/ Protection: None   Other Topics Concern  . Not on file   Social History Narrative     Filed Vitals:   12/17/13 0927  BP: 98/72  Pulse: 83  Height: 4\' 11"  (1.499 m)  Weight: 96 lb (43.545 kg)  SpO2: 99%    PHYSICAL EXAM General: NAD HEENT: Normal. Neck: No JVD, no thyromegaly. Lungs: Clear to auscultation bilaterally with normal respiratory effort. CV: Nondisplaced PMI.  Regular rate and rhythm, normal S1/S2, no S3/S4, no murmur. No pretibial or periankle edema.  No carotid bruit.  Normal pedal pulses.  Abdomen: Soft, nontender, no hepatosplenomegaly, no distention.  Neurologic: Alert and oriented x 3.  Psych: Normal affect. Skin: Normal. Musculoskeletal: Normal range of motion, no gross deformities. Extremities: No clubbing or cyanosis.   ECG: Most recent ECG reviewed.      ASSESSMENT AND PLAN: 1. Autonomic dysfunction/recurrent syncope: This is been a debilitating disease for her since childhood. She has taken appropriate measures and stopped alcohol consumption as well as caffeine intake and primarily drinks water. She believes she consumes an adequate amount of sodium as she eats out significantly. Autonomic dysfunction can be very difficult to treat particularly with blood pressure swings with systolic readings in the 90-140 mmHg range. Given her symptom control, I will continue low-dose metoprolol 12.5 mg twice daily. I have instructed her to make certain she stands up slowly so as to avoid  symptomatic hypotension. 2. Tachycardia: HR elevates quickly when going from the sitting to upright position. As stated previously, I will continue low-dose metoprolol 12.5 mg twice daily. 3. Photophobia: Likely due to compromised pupillary constrictive ability from autonomic dysfunction. I have suggested she see an ophthalmologist to see if there are any eyedrops she can take to help with this.   Dispo:  f/u 6 months.   Prentice DockerSuresh Koneswaran, M.D., F.A.C.C.

## 2014-01-17 ENCOUNTER — Ambulatory Visit (INDEPENDENT_AMBULATORY_CARE_PROVIDER_SITE_OTHER): Payer: BLUE CROSS/BLUE SHIELD | Admitting: Internal Medicine

## 2014-01-17 ENCOUNTER — Encounter (INDEPENDENT_AMBULATORY_CARE_PROVIDER_SITE_OTHER): Payer: Self-pay | Admitting: Internal Medicine

## 2014-01-17 VITALS — BP 102/72 | HR 76 | Temp 98.1°F | Ht 59.5 in | Wt 97.7 lb

## 2014-01-17 DIAGNOSIS — K5909 Other constipation: Secondary | ICD-10-CM

## 2014-01-17 NOTE — Patient Instructions (Signed)
OV in 6 months.  Continue the Miralax and Colace

## 2014-01-17 NOTE — Progress Notes (Addendum)
   Subjective:    Patient ID: Jacqueline GilbertSue Moore, female    DOB: [birth year], 39 y.o.   MRN: [id number]  HPIHere today for f/u of her constipation. PCP Dr. Phillips OdorGolding.  Last seen in November for same. Hx of chronic constipation since early 20s. She previously was going once a week.  She has been taking Miralax on a daily basis. She also takes Colace.  She says she tried the Linzess but it stopped working. She is having a BM now every 3rd or 4th day. She does feel better. Appetite is good. No weight loss. No melena or BRRB. She is a skin care therapist.  She walks 3-4 times a weeks.  Family hx of colon cancer in an aunt in her 3450s.   -   No melena or BRRB.   Review of Systems Past Medical History  Diagnosis Date  . Tachycardia     No past surgical history on file.  Allergies  Allergen Reactions  . Ivp Dye [Iodinated Diagnostic Agents] Nausea And Vomiting  . Prednisone Other (See Comments)    Behavior changes     Current Outpatient Prescriptions on File Prior to Visit  Medication Sig Dispense Refill  . ibuprofen (ADVIL,MOTRIN) 200 MG tablet Take 200 mg by mouth every 6 (six) hours as needed for pain.    . metoprolol tartrate (LOPRESSOR) 25 MG tablet Take 0.5 tablets (12.5 mg total) by mouth 2 (two) times daily. 90 tablet 3  . Prenatal Multivit-Min-Fe-FA (PRENATAL VITAMINS PO) Take by mouth.     No current facility-administered medications on file prior to visit.        Objective:   Physical Exam  Filed Vitals:   01/17/14 1019  Height: 4' 11.5" (1.511 m)  Weight: 97 lb 11.2 oz (44.316 kg)  Alert and oriented. Skin warm and dry. Oral mucosa is moist.   . Sclera anicteric, conjunctivae is pink. Thyroid not enlarged. No cervical lymphadenopathy. Lungs clear. Heart regular rate and rhythm.  Abdomen is soft. Bowel sounds are positive. No hepatomegaly. No abdominal masses felt. No tenderness.  No edema to lower extremities.         Assessment & Plan:  Chronic constipation. She is  better since using the MIralax and Colace. Having a BM every 3-4 days. Could try Metamucil.  OV in 6 months

## 2014-02-02 ENCOUNTER — Telehealth: Payer: Self-pay | Admitting: *Deleted

## 2014-02-02 NOTE — Telephone Encounter (Signed)
Medical Clearance for dental surgery faxed to Malachi ParadiseJoe Adams, DDS office and placed in bin to be scaned

## 2014-07-25 ENCOUNTER — Ambulatory Visit (INDEPENDENT_AMBULATORY_CARE_PROVIDER_SITE_OTHER): Payer: Self-pay | Admitting: Internal Medicine

## 2014-07-25 ENCOUNTER — Encounter (INDEPENDENT_AMBULATORY_CARE_PROVIDER_SITE_OTHER): Payer: Self-pay | Admitting: Internal Medicine

## 2014-07-25 VITALS — BP 110/80 | HR 76 | Temp 98.6°F | Resp 18 | Ht 59.5 in | Wt 96.2 lb

## 2014-07-25 DIAGNOSIS — K5909 Other constipation: Secondary | ICD-10-CM

## 2014-07-25 MED ORDER — GLYCERIN (LAXATIVE) 2 G RE SUPP: 1.0000 | RECTAL | Status: DC

## 2014-07-25 NOTE — Patient Instructions (Signed)
High fiber diet. Eat an apple and few prunes daily. Use glycerin suppository 3 times a week and if it does not work and switch to Dulcolax suppository. Stool and symptom diary as discussed.

## 2014-07-25 NOTE — Progress Notes (Signed)
Presenting complaint;  Follow-up for chronic constipation.  Subjective:  Jacqueline [name] is 39 year old Caucasian female who is here for scheduled visit. She was last seen on 01/17/2014 by Ms. Setzer NP. She was given Linzess 145 g daily but it only worked for 3 weeks. She is back to taking MiraLAX and has to take OTC laxity of 2-4 times a month. He is trying to eat more fruits and vegetables. She also writes a bike and walks regularly. She states she's had constipation for at least 15 years. She has gone as long as one month without a bowel movement. She rarely has an urge to have a bowel movement. Once she was given MoviPrep by Dr. Hassan RowanKoberlein. Her bowels move once or twice a week. She never has a sense of complete evacuation. The longer she goes without a bowel movement nor preloaded she feels and she also has nausea and pain across lower abdomen. She denies vomiting melena or rectal bleeding. She does have hemorrhoid which flares up every now and then. Her appetite is normal and she has not lost any weight since her last visit. She is trying to get pregnant and therefore stopped metoprolol which she was given for tachycardia palpitations and syncope to be due to autonomic dysfunction. Patient recalls that a few years ago when she was going through divorce she developed postprandial diarrhea and urgency.    Current Medications: Outpatient Encounter Prescriptions as of 07/25/2014  Medication Sig  . OVER THE COUNTER MEDICATION Organic Smooth Move - Peppermint tea - Senna Stimulant Laxative - Patient states that she drinks 1 time per week or 1 time every other week.  . polyethylene glycol (MIRALAX / GLYCOLAX) packet Take 17 g by mouth daily.  . [DISCONTINUED] ibuprofen (ADVIL,MOTRIN) 200 MG tablet Take 200 mg by mouth every 6 (six) hours as needed for pain.  . [DISCONTINUED] metoprolol tartrate (LOPRESSOR) 25 MG tablet Take 0.5 tablets (12.5 mg total) by mouth 2 (two) times daily. (Patient not taking:  Reported on 07/25/2014)  . [DISCONTINUED] Prenatal Multivit-Min-Fe-FA (PRENATAL VITAMINS PO) Take by mouth.   No facility-administered encounter medications on file as of 07/25/2014.     Objective: Blood pressure 110/80, pulse 76, temperature 98.6 F (37 C), temperature source Oral, resp. rate 18, height 4' 11.5" (1.511 m), weight 96 lb 3.2 oz (43.636 kg), last menstrual period 07/06/2014. Patient is alert and in no acute distress. Conjunctiva is pink. Sclera is nonicteric Oropharyngeal mucosa is normal. No neck masses or thyromegaly noted. Cardiac exam with regular rhythm normal S1 and S2. No murmur or gallop noted. Lungs are clear to auscultation. Abdomen is flat. Bowel sounds are normal. On palpation abdomen is soft with mild tenderness at LLQ. No organomegaly or masses. No LE edema or clubbing noted.  Labs/studies Results: Lab data from 05/03/2013 CBC 5.4, H&H 14.5 and 41.5 and platelet count 288K Serum calcium 9.7 TSH 1.307  Assessment:  #1. Chronic constipation. 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 She has not had desirable results with therapy. If no therapy works will consider flexible sigmoidoscopy prior to considering anorectal manometry.   Plan:  Continue high fiber diet. Eat few prunes and one apple every day. Glycerin and/or Dulcolax suppository every other day for a few weeks and then when necessary. Continue polyethylene glycol at present dose. Symptom and stool diary until office visit in 2 months.

## 2014-10-11 ENCOUNTER — Ambulatory Visit (INDEPENDENT_AMBULATORY_CARE_PROVIDER_SITE_OTHER): Payer: BLUE CROSS/BLUE SHIELD | Admitting: Internal Medicine

## 2014-11-14 ENCOUNTER — Ambulatory Visit (INDEPENDENT_AMBULATORY_CARE_PROVIDER_SITE_OTHER): Payer: Self-pay | Admitting: Internal Medicine

## 2016-03-12 ENCOUNTER — Other Ambulatory Visit: Payer: Self-pay | Admitting: Obstetrics and Gynecology

## 2016-03-12 DIAGNOSIS — R928 Other abnormal and inconclusive findings on diagnostic imaging of breast: Secondary | ICD-10-CM

## 2016-03-18 ENCOUNTER — Ambulatory Visit
Admission: RE | Admit: 2016-03-18 | Discharge: 2016-03-18 | Disposition: A | Discharge status: Home / Self Care | Payer: No Typology Code available for payment source | Source: Ambulatory Visit | Attending: Obstetrics and Gynecology | Admitting: Obstetrics and Gynecology

## 2016-03-18 ENCOUNTER — Other Ambulatory Visit: Payer: Self-pay

## 2016-03-18 ENCOUNTER — Other Ambulatory Visit: Payer: Self-pay | Admitting: Obstetrics and Gynecology

## 2016-03-18 DIAGNOSIS — N6310 Unspecified lump in the right breast, unspecified quadrant: Secondary | ICD-10-CM

## 2016-03-18 DIAGNOSIS — R928 Other abnormal and inconclusive findings on diagnostic imaging of breast: Secondary | ICD-10-CM

## 2016-03-25 ENCOUNTER — Ambulatory Visit
Admission: RE | Admit: 2016-03-25 | Discharge: 2016-03-25 | Disposition: A | Discharge status: Home / Self Care | Payer: No Typology Code available for payment source | Source: Ambulatory Visit | Attending: Obstetrics and Gynecology | Admitting: Obstetrics and Gynecology

## 2016-03-25 ENCOUNTER — Other Ambulatory Visit: Payer: Self-pay | Admitting: Obstetrics and Gynecology

## 2016-03-25 DIAGNOSIS — N6310 Unspecified lump in the right breast, unspecified quadrant: Secondary | ICD-10-CM

## 2017-12-03 ENCOUNTER — Other Ambulatory Visit: Payer: Self-pay

## 2017-12-07 ENCOUNTER — Other Ambulatory Visit: Payer: Self-pay | Admitting: Plastic Surgery

## 2017-12-07 MED ORDER — LIDOCAINE-PRILOCAINE 2.5-2.5 % EX CREA: 1.0000 "application " | TOPICAL_CREAM | CUTANEOUS | 0 refills | Status: DC | PRN

## 2017-12-07 NOTE — Progress Notes (Signed)
Request for procedure EMLA cream

## 2017-12-09 ENCOUNTER — Ambulatory Visit (INDEPENDENT_AMBULATORY_CARE_PROVIDER_SITE_OTHER): Payer: Self-pay | Admitting: Plastic Surgery

## 2017-12-09 ENCOUNTER — Encounter: Payer: Self-pay | Admitting: Plastic Surgery

## 2017-12-09 DIAGNOSIS — Z719 Counseling, unspecified: Secondary | ICD-10-CM | POA: Insufficient documentation

## 2017-12-09 NOTE — Progress Notes (Signed)
   Subjective:    Patient ID: Jacqueline [name], female    DOB: [birth year], 42 y.o.   MRN: [id number]  The patient is a 42 year old female here for evaluation of her face for rejuvenation.  She had filler put in her upper lip a year ago and 3 months ago.  The last one was to return to class.  It appears she has had the Tyndall affect with flu discoloration in the upper lip.  The symmetry is reasonable and the result is reasonable.  Overall she has extremely good skin for her age.  She had a IPL treatment a week ago.  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   Review of Systems  Constitutional: Negative.   HENT: Negative.   Eyes: Negative.   Respiratory: Negative.   Gastrointestinal: Negative.   Endocrine: Negative.   Genitourinary: Negative.   Musculoskeletal: Negative.   Skin: Negative.       Objective:   Physical Exam  Constitutional: She appears well-developed and well-nourished.  HENT:  Head: Normocephalic and atraumatic.  Eyes: Pupils are equal, round, and reactive to light.  Neurological: She is alert.      Assessment & Plan:  Encounter for counseling Recommend that she wait and let the filler from around her lips resolve.  She would be a good candidate for filler in the midface later on.  Maybe 9 months from now.  She could get Botox in the forehead and glabella area

## 2017-12-12 ENCOUNTER — Encounter: Payer: No Typology Code available for payment source | Admitting: Plastic Surgery

## 2018-01-02 ENCOUNTER — Ambulatory Visit (INDEPENDENT_AMBULATORY_CARE_PROVIDER_SITE_OTHER): Payer: Self-pay | Admitting: Plastic Surgery

## 2018-01-02 ENCOUNTER — Encounter: Payer: Self-pay | Admitting: Plastic Surgery

## 2018-01-02 VITALS — BP 100/58 | HR 88 | Ht 59.5 in | Wt 104.0 lb

## 2018-01-02 DIAGNOSIS — Z719 Counseling, unspecified: Secondary | ICD-10-CM

## 2018-01-02 NOTE — Progress Notes (Signed)
Botulinum Toxin Procedure Note  Procedure: Cosmetic botulinum toxin   Pre-operative Diagnosis: Dynamic rhytides   Post-operative Diagnosis: Same  Complications:  None  Brief history: The patient desires botulinum toxin injection of her forehead. I discussed with the patient this proposed procedure of botulinum toxin injections, which is customized depending on the particular needs of the patient. It is performed on facial rhytids as a temporary correction. The alternatives were discussed with the patient. The risks were addressed including bleeding, scarring, infection, damage to deeper structures, asymmetry, and chronic pain, which may occur infrequently after a procedure. The individual's choice to undergo a surgical procedure is based on the comparison of risks to potential benefits. Other risks include unsatisfactory results, brow ptosis, eyelid ptosis, allergic reaction, temporary paralysis, which should go away with time, bruising, blurring disturbances and delayed healing. Botulinum toxin injections do not arrest the aging process or produce permanent tightening of the eyelid.  Operative intervention maybe necessary to maintain the results of a blepharoplasty or botulinum toxin. The patient understands and wishes to proceed. An informed consent was signed and informational brochures given to her prior to the procedure.  Procedure: The area was prepped with alcohol and dried with a clean gauze. Using a clean technique, the botulinum toxin was diluted with 1.25 cc of preservative-free normal saline which was slowly injected with an 18 gauge needle in a tuberculin syringes.  A 32 gauge needles were then used to inject the botulinum toxin. This mixture allow for an aliquot of 5 units per 0.1 cc in each injection site.    Subsequently the mixture was injected in the glabellar and forehead area with preservation of the temporal branch to the lateral eyebrow as well as into each lateral canthal area  beginning from the lateral orbital rim medial to the zygomaticus major in 3 separate areas. A total of 25 Units of botulinum toxin was used. The forehead and glabellar area was injected with care to inject intramuscular only while holding pressure on the supratrochlear vessels in each area during each injection on either side of the medial corrugators. The injection proceeded vertically superiorly to the medial 2/3 of the frontalis muscle and superior 2/3 of the lateral frontalis, again with preservation of the frontal branch.  No complications were noted. Light pressure was held for 5 minutes. She was instructed explicitly in post-operative care.  Botox LOT:  [id number] C2 EXP:  5/22  

## 2018-01-16 ENCOUNTER — Encounter: Payer: No Typology Code available for payment source | Admitting: Plastic Surgery

## 2018-04-15 ENCOUNTER — Other Ambulatory Visit: Payer: Self-pay | Admitting: Internal Medicine

## 2018-04-15 DIAGNOSIS — G6289 Other specified polyneuropathies: Secondary | ICD-10-CM

## 2018-04-15 DIAGNOSIS — R51 Headache: Principal | ICD-10-CM

## 2018-04-15 DIAGNOSIS — R519 Headache, unspecified: Secondary | ICD-10-CM

## 2018-04-20 ENCOUNTER — Other Ambulatory Visit: Payer: Self-pay

## 2018-04-20 ENCOUNTER — Ambulatory Visit
Admission: RE | Admit: 2018-04-20 | Discharge: 2018-04-20 | Disposition: A | Discharge status: Home / Self Care | Payer: No Typology Code available for payment source | Source: Ambulatory Visit | Attending: Internal Medicine | Admitting: Internal Medicine

## 2018-04-20 DIAGNOSIS — G6289 Other specified polyneuropathies: Secondary | ICD-10-CM

## 2018-04-20 DIAGNOSIS — R51 Headache: Principal | ICD-10-CM

## 2018-04-20 DIAGNOSIS — R519 Headache, unspecified: Secondary | ICD-10-CM

## 2018-04-24 ENCOUNTER — Encounter: Payer: Self-pay | Admitting: Neurology

## 2018-07-06 ENCOUNTER — Ambulatory Visit: Payer: Self-pay | Admitting: Neurology

## 2018-08-07 ENCOUNTER — Ambulatory Visit (INDEPENDENT_AMBULATORY_CARE_PROVIDER_SITE_OTHER): Payer: Self-pay | Admitting: Neurology

## 2018-08-07 ENCOUNTER — Encounter: Payer: Self-pay | Admitting: Neurology

## 2018-08-07 ENCOUNTER — Other Ambulatory Visit: Payer: Self-pay

## 2018-08-07 VITALS — BP 117/80 | HR 88 | Temp 98.8°F | Ht 59.5 in | Wt 103.0 lb

## 2018-08-07 DIAGNOSIS — R6889 Other general symptoms and signs: Secondary | ICD-10-CM

## 2018-08-07 DIAGNOSIS — G509 Disorder of trigeminal nerve, unspecified: Secondary | ICD-10-CM

## 2018-08-07 NOTE — Progress Notes (Signed)
NEUROLOGY CONSULTATION NOTE  Jacqueline [name] MRN: [id number] DOB: 30-Jan-1975  Referring provider: Assunta FoundJohn Golding, MD Primary care provider: Assunta FoundJohn Golding, MD  Reason for consult:  Evaluate for MS  HISTORY OF PRESENT ILLNESS: Jacqueline [name] is a 43 year old right handed Caucasian female who presents for evaluation of MS.  History supplemented by referring provider note.    She was evaluated by neurology in 2015 for fatigue, mental fog, intermittent fuzzy vision as well as fainting spells and episodes of numbness and tingling sensation.  She had an MRI of the brain with and without contrast on 05/10/13 which was personally reviewed and unremarkable except for incidental posterior fossa arachnoid cyst.  Labs, such as TSH, B12, ANA, lupus were negative.  She was prescribed Cymbalta.    In January, she had a respiratory illness.  In early February, she started having significant left sided facial aching pain involving the cheek and jaw, associated with burning and tingling sensation in the left V2-V3 distribution including left side of tongue.  It started after a dental procedure so the dentist performed a panoramic X-ray which was unremarkable.  Around the same time, she reports increased emotional stress as well.  She went to her PCP who diagnosed her with shingles and treated with valacyclovir and prednisone and symptoms resolved.  Then in early April, she had a recurrence of the left sided facial paresthesias and burning but not the aching pain.  For a short time, she thought she noted mild symptoms involving the right side, but that didn't last too long.  No associated facial weakness.  MRI of brain without contrast from 04/20/18 was personally reviewed and demonstrated an incidental posterior fossa arachnoid cyst, which appears stable compared to prior MRI of brain from 2015.  B12 in May was in the 200s and she was started on supplement.  Vitamin D was low, so she was started on supplement as  well.  Thyroid testing reportedly unremarkable. Reportedly no evidence of diabetes.  She has longstanding history of multiple chronic symptoms.  In 2011, she started having extreme fatigue and muscle weakness.  Since childhood, she has had recurrent episodes of fainting.  Last spell was in 2014.  TTE at that time showed EF 55-60%.  She was told it was due to low blood pressure.  Questionable autonomic dysfunction, such as POTs.  For many years, she has suffered from fatigue, muscle weakness and mental fog, occasional numbness and tingling in her arms.  She also has episodes of visual disturbance, described as a blurry floater that moves into her vision from the left, lasting 20 minutes and resolves.  No associated headache.  She has some mild headaches around her cycle.  Family History: Mother:  Seizure disorder; MI, CVA/TIA, hypothyroidism, headaches Father:  CAD, hypothyroidism  PAST MEDICAL HISTORY: Past Medical History:  Diagnosis Date  . Tachycardia     PAST SURGICAL HISTORY: No past surgical history on file.  MEDICATIONS: Current Outpatient Medications on File Prior to Visit  Medication Sig Dispense Refill  . glycerin adult (GLYCERIN ADULT) 2 G SUPP Place 1 suppository rectally every Monday, Wednesday, and Friday. 12 each 1  . lidocaine-prilocaine (EMLA) cream Apply 1 application topically as needed. Apply to area of injection 45 minutes prior to injection. (Patient not taking: Reported on 01/02/2018) 30 g 0  . OVER THE COUNTER MEDICATION Organic Smooth Move - Peppermint tea - Senna Stimulant Laxative - Patient states that she drinks 1 time per week or 1 time  every other week.    . polyethylene glycol (MIRALAX / GLYCOLAX) packet Take 17 g by mouth daily.     No current facility-administered medications on file prior to visit.     ALLERGIES: Allergies  Allergen Reactions  . Ivp Dye [Iodinated Diagnostic Agents] Nausea And Vomiting  . Prednisone Other (See Comments)    Behavior  changes     FAMILY HISTORY: Mother:  Seizure disorder; MI, CVA/TIA, hypothyroidism, headaches Father:  CAD, hypothyroidism  SOCIAL HISTORY: Social History   Socioeconomic History  . Marital status: Married    Spouse name: Not on file  . Number of children: Not on file  . Years of education: Not on file  . Highest education level: Not on file  Occupational History  . Not on file  Social Needs  . Financial resource strain: Not on file  . Food insecurity    Worry: Not on file    Inability: Not on file  . Transportation needs    Medical: Not on file    Non-medical: Not on file  Tobacco Use  . Smoking status: Never Smoker  . Smokeless tobacco: Never Used  Substance and Sexual Activity  . Alcohol use: No    Alcohol/week: 0.0 standard drinks  . Drug use: No  . Sexual activity: Yes    Birth control/protection: None  Lifestyle  . Physical activity    Days per week: Not on file    Minutes per session: Not on file  . Stress: Not on file  Relationships  . Social Musicianconnections    Talks on phone: Not on file    Gets together: Not on file    Attends religious service: Not on file    Active member of club or organization: Not on file    Attends meetings of clubs or organizations: Not on file    Relationship status: Not on file  . Intimate partner violence    Fear of current or ex partner: Not on file    Emotionally abused: Not on file    Physically abused: Not on file    Forced sexual activity: Not on file  Other Topics Concern  . Not on file  Social History Narrative  . Not on file    REVIEW OF SYSTEMS: Constitutional: No fevers, chills, or sweats, no generalized fatigue, change in appetite Eyes: No visual changes, double vision, eye pain Ear, nose and throat: No hearing loss, ear pain, nasal congestion, sore throat Cardiovascular: No chest pain, palpitations Respiratory:  No shortness of breath at rest or with exertion, wheezes GastrointestinaI: No nausea, vomiting,  diarrhea, abdominal pain, fecal incontinence Genitourinary:  No dysuria, urinary retention or frequency Musculoskeletal:  No neck pain, back pain Integumentary: No rash, pruritus, skin lesions Neurological: as above Psychiatric: No depression, insomnia, anxiety Endocrine: No palpitations, fatigue, diaphoresis, mood swings, change in appetite, change in weight, increased thirst Hematologic/Lymphatic:  No purpura, petechiae. Allergic/Immunologic: no itchy/runny eyes, nasal congestion, recent allergic reactions, rashes  PHYSICAL EXAM: Blood pressure 117/80, pulse 88, temperature 98.8 F (37.1 C), height 4' 11.5" (1.511 m), weight 103 lb (46.7 kg), SpO2 100 %. General: No acute distress.  Patient appears well-groomed.  Head:  Normocephalic/atraumatic Eyes:  fundi examined but not visualized Neck: supple, no paraspinal tenderness, full range of motion Back: No paraspinal tenderness Heart: regular rate and rhythm Lungs: Clear to auscultation bilaterally. Vascular: No carotid bruits. Neurological Exam: Mental status: alert and oriented to person, place, and time, recent and remote memory intact, fund of  knowledge intact, attention and concentration intact, speech fluent and not dysarthric, language intact. Cranial nerves: CN I: not tested CN II: pupils equal, round and reactive to light, visual fields intact CN III, IV, VI:  full range of motion, no nystagmus, no ptosis CN V: facial sensation intact CN VII: upper and lower face symmetric CN VIII: hearing intact CN IX, X: gag intact, uvula midline CN XI: sternocleidomastoid and trapezius muscles intact CN XII: tongue midline Bulk & Tone: normal, no fasciculations. Motor:  5/5 throughout  Sensation:  Pinprick and vibration sensation intact. Deep Tendon Reflexes:  2+ throughout, toes downgoing.  Finger to nose testing:  Without dysmetria.  Heel to shin:  Without dysmetria.  Gait:  Normal station and stride.  Able to turn and tandem walk.  Romberg negative.  IMPRESSION: 1.  Left facial dysesthesias/paresthesias.  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  No evidence of demyelinating disease on MRI. 2.  Ocular migraines 3.  Various other chronic somatic symptoms, which I suspect are unrelated.  PLAN: 1.  We will check MRI of the left trigeminal nerve with and without contrast to evaluate for any structural etiology that may be contributing to this unusual presentation that has been ongoing for 6 months. 2.  We will also check lab work for ACE and Lyme. 3.  Defers treatment for the burning at this time. 4.  Further recommendations pending results.  Follow up in 3 to 4 months.  Thank you for allowing me to take part in the care of this patient.  Metta Clines, DO  CC: Sharilyn Sites, MD

## 2018-08-07 NOTE — Patient Instructions (Addendum)
I think you did probably have a viral infection of the trigeminal nerve (such as shingles).  We will check for other possibilities: 1.  MRI of left trigeminal nerve with and without contrast. 2.  Lab work:  ACE, Lyme  Follow up in 4 months.  Your provider has requested that you have labwork completed today. Please go to [facility] ([id number]) on the second floor of this building before leaving the office today. You do not need to check in. If you are not called within 15 minutes please check with the front desk.   We have sent a referral to [facility] for your MRI and they will call you directly to schedule your appointment. They are located at [address]. If you need to contact them directly, or you have not been contacted to schedule an appointment within 5-7 days,  please call [phone].

## 2018-08-10 ENCOUNTER — Other Ambulatory Visit: Payer: Self-pay | Admitting: Neurology

## 2018-08-11 LAB — LYME AB/WESTERN BLOT REFLEX
LYME DISEASE AB, QUANT, IGM: <0.8 index (ref 0.00–0.79)
Lyme IgG/IgM Ab: <0.91 {ISR} (ref 0.00–0.90)

## 2018-08-11 LAB — ANGIOTENSIN CONVERTING ENZYME: Angio Convert Enzyme: 22 U/L (ref 14–82)

## 2018-08-13 ENCOUNTER — Telehealth: Payer: Self-pay

## 2018-08-13 NOTE — Telephone Encounter (Signed)
-----   Message from Pieter Partridge, DO sent at 08/12/2018  6:25 AM EDT ----- Lab tests are normal

## 2018-08-13 NOTE — Telephone Encounter (Signed)
Lab results sent to Pt via My chart

## 2018-09-11 ENCOUNTER — Ambulatory Visit
Admission: RE | Admit: 2018-09-11 | Discharge: 2018-09-11 | Disposition: A | Discharge status: Home / Self Care | Payer: No Typology Code available for payment source | Source: Ambulatory Visit | Attending: Neurology | Admitting: Neurology

## 2018-09-11 ENCOUNTER — Other Ambulatory Visit: Payer: Self-pay

## 2018-09-11 DIAGNOSIS — G509 Disorder of trigeminal nerve, unspecified: Secondary | ICD-10-CM

## 2018-09-11 DIAGNOSIS — R6889 Other general symptoms and signs: Secondary | ICD-10-CM

## 2018-09-11 IMAGING — MR MR FACE/TRIGEMINAL WO/W CM
5 of 8 series · 23 of 48 positions shown · IV contrast (9ml multihance)
Comparison: Prior MRI from [DATE].

CLINICAL DATA: Initial evaluation for left-sided facial and tongue
burning/tingling since [DATE]. Trigeminal neuropathy.

EXAM:
MRI FACE TRIGEMINAL WITHOUT AND WITH CONTRAST
TECHNIQUE: Multiplanar, multiecho pulse sequences of the face and surrounding
structures, including thin slice imaging of the course of the
Trigeminal Nerves, were obtained both before and after
administration of intravenous contrast.
CONTRAST:  9mL MULTIHANCE GADOBENATE DIMEGLUMINE 529 MG/ML IV SOLN

[Series 2: T1 · sagittal · 3.0mm · 0.35mm/px · 7 of 40 slices shown (1 of 3)]
[im 1/40]
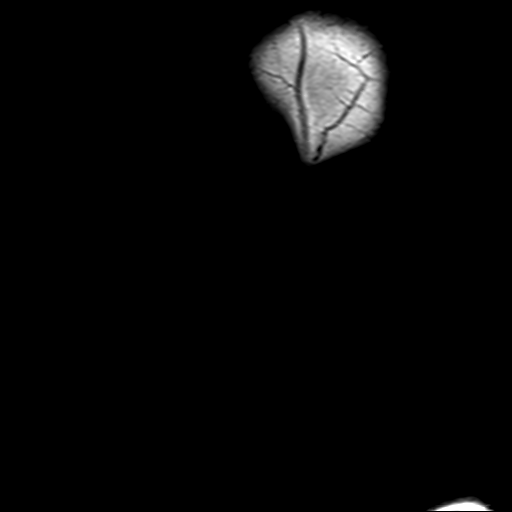
[im 7/40]
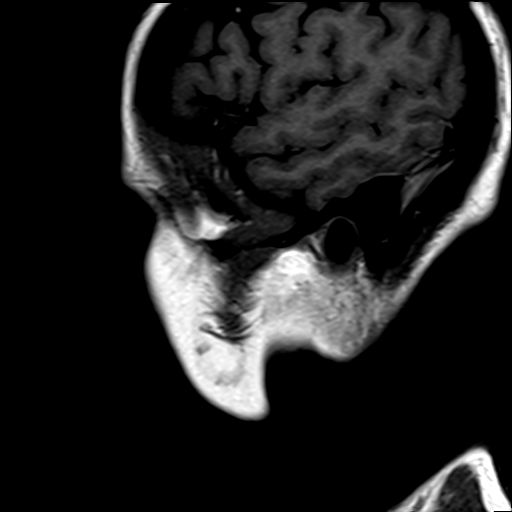
[im 14/40]
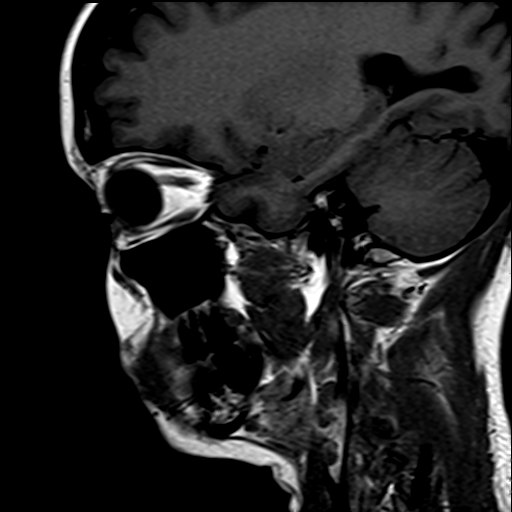
[im 20/40]
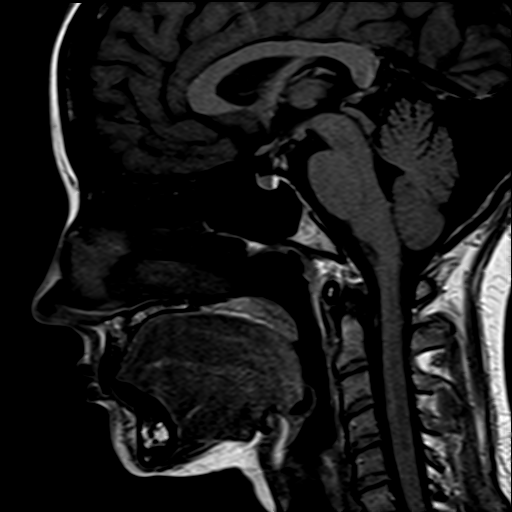
[im 27/40]
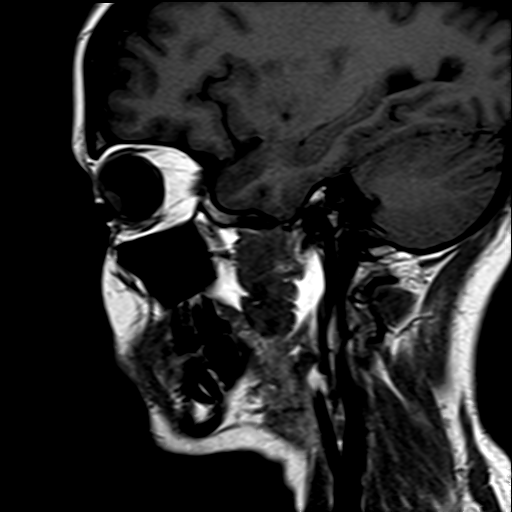
[im 33/40]
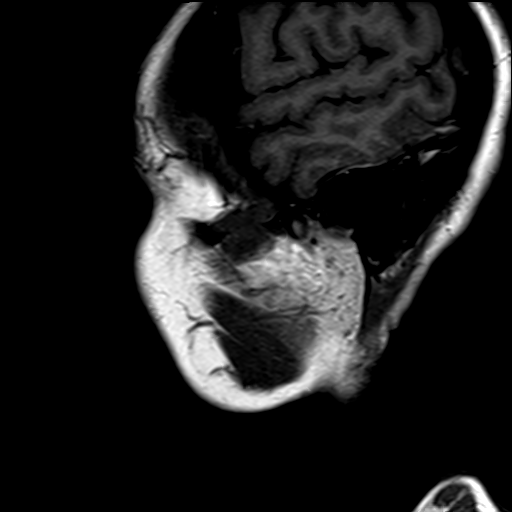
[im 40/40]
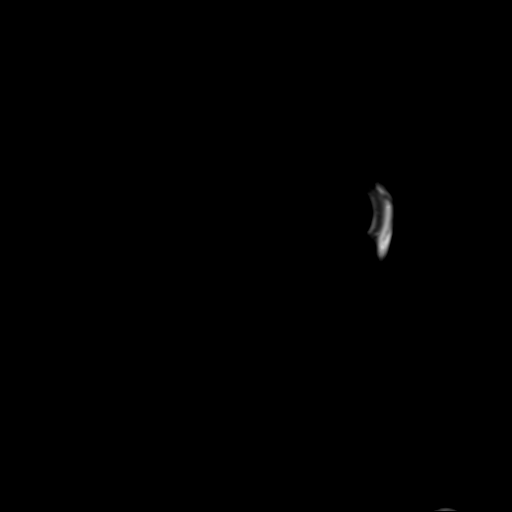

[Series 3: T2 · coronal · 3.0mm · 0.35mm/px · 5 of 30 slices shown]
[im 1/30]
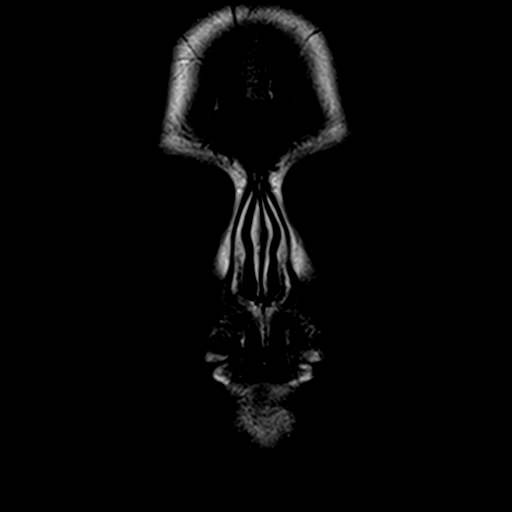
[im 8/30]
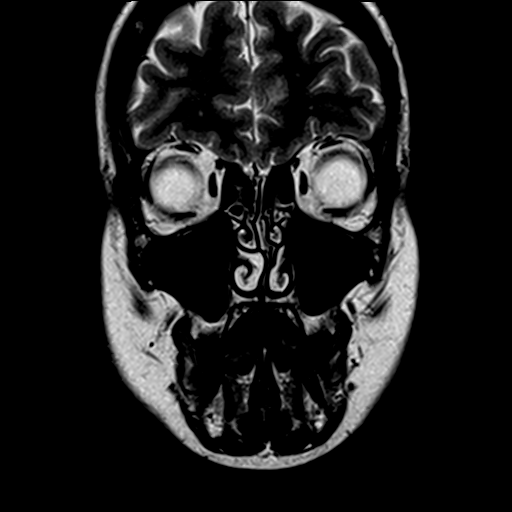
[im 15/30]
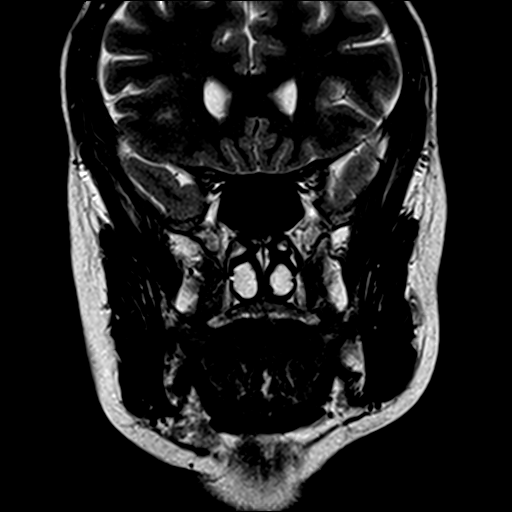
[im 22/30]
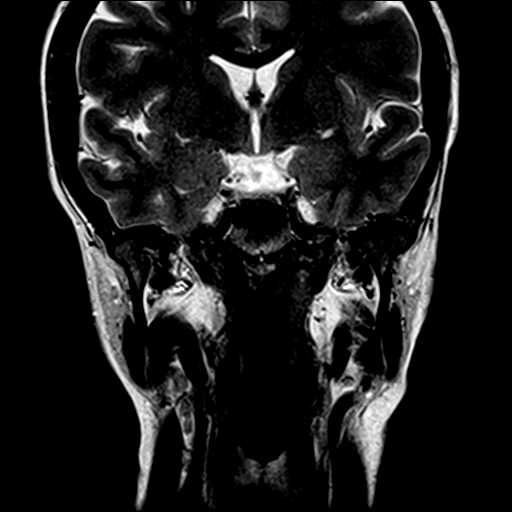
[im 30/30]
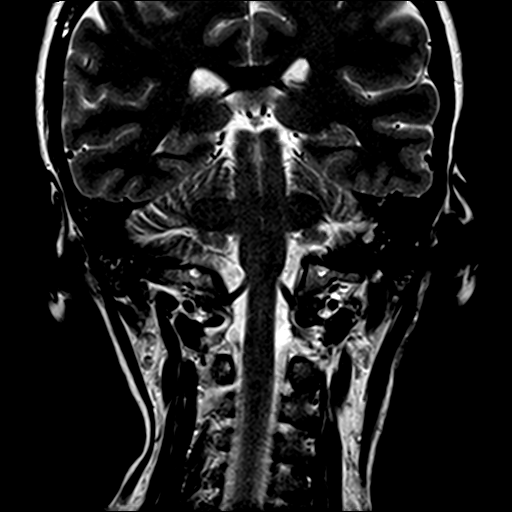

[Series 6: T1 · coronal · 3.0mm · 0.35mm/px · 5 of 30 slices shown (2 of 3)]
[im 1/30]
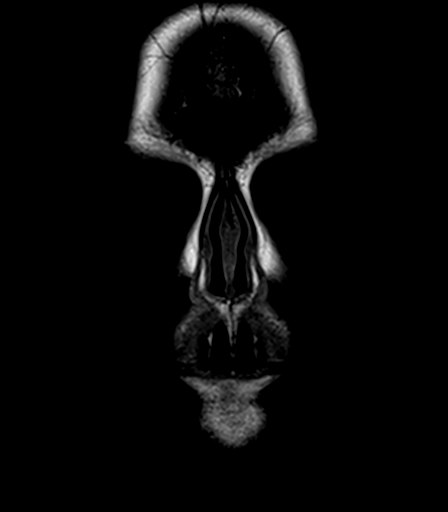
[im 8/30]
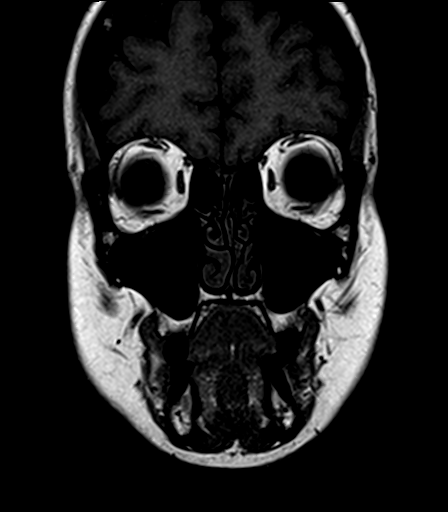
[im 15/30]
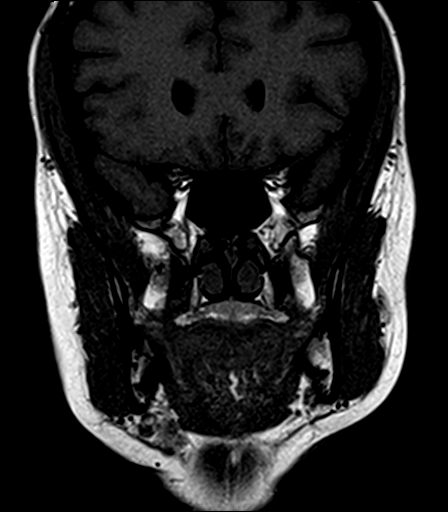
[im 22/30]
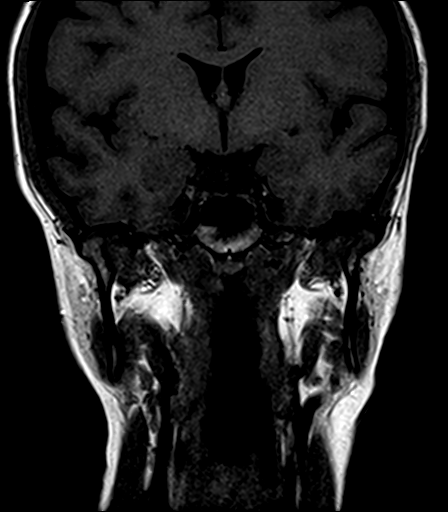
[im 30/30]
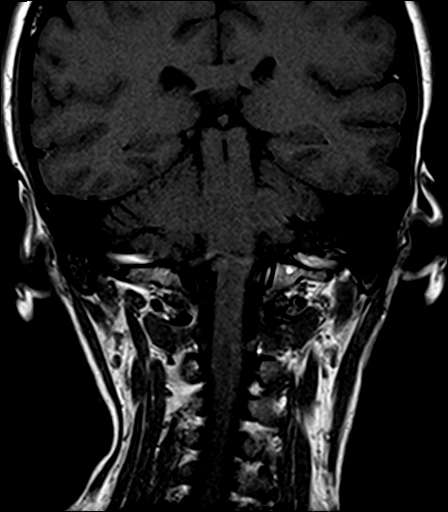

[Series 7: T1 · axial · 3.0mm · 0.50mm/px · z∈[-127,-23]mm · 5 of 28 slices shown (3 of 3)]
[im 1/28]
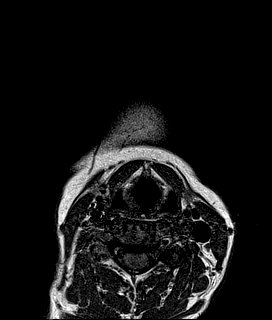
[im 7/28]
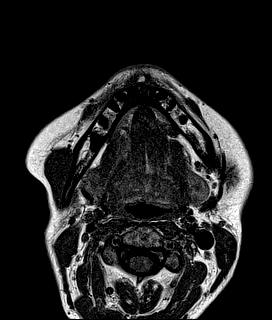
[im 14/28]
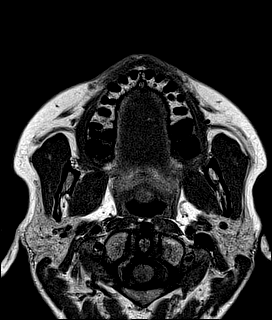
[im 21/28]
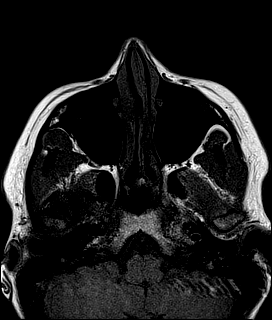
[im 28/28]
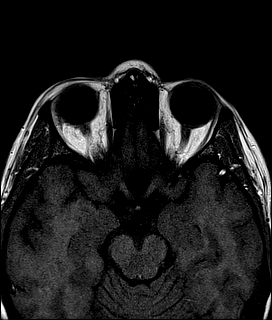

[Series 8: T1 fat-sat · coronal · 3.0mm · 0.35mm/px · 1 of 30 slices shown]
[im 1/30]
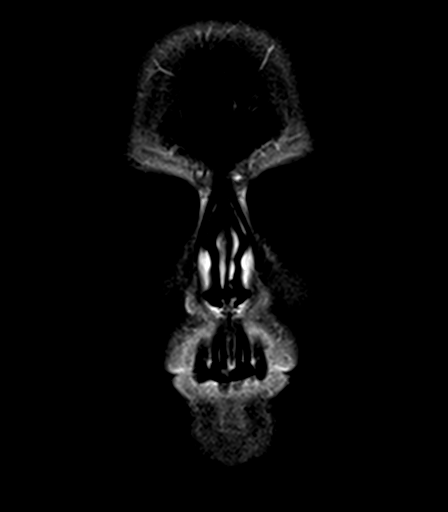

[23 of 48 positions shown; findings below may reference images not displayed]

FINDINGS: Brain: Visualized portions of the brain are unremarkable. No
cerebral white matter changes to suggest demyelinating disease or
other abnormality. Cerebral volume normal.

Thin section imaging through the skull base and pons was performed.
Trigeminal nerves seen exiting normally from the pons and entering
into Meckel's cave bilaterally. No abnormal enhancement about the
cisternal segments. No vascular impingement upon the nerve root
entry zone. No CP angle mass or other mass about the cisternal
segments identified. No abnormality about the expected exit regions
at foramen ovale and foramen rotundum. No mass lesion or other
abnormality within either infratemporal fossa or visualized face. No
abnormal enhancement.

Remainder of the visualized brain is normal in appearance.

Vascular: Major intracranial vascular flow voids well maintained and
normal in appearance. No signal changes to suggest aneurysm
identified.

Skull and upper cervical spine: Craniocervical junction normal.
Visualized upper cervical spine within normal limits. Bone marrow
signal intensity normal. No scalp soft tissue abnormality.

Sinuses/Orbits: Globes and orbital soft tissues within normal
limits.

Paranasal sinuses are clear. No mastoid effusion. Inner ear
structures normal.

Other: None.
IMPRESSION: Normal trigeminal nerve protocol MRI. No findings to explain
patient's symptoms identified.

## 2018-09-11 MED ORDER — GADOBENATE DIMEGLUMINE 529 MG/ML IV SOLN
9.0000 mL | Freq: Once | INTRAVENOUS | Status: AC | PRN
  Administered 2018-09-11: 9 mL via INTRAVENOUS

## 2018-09-21 ENCOUNTER — Other Ambulatory Visit: Payer: Self-pay

## 2018-09-21 ENCOUNTER — Ambulatory Visit (INDEPENDENT_AMBULATORY_CARE_PROVIDER_SITE_OTHER): Payer: Self-pay | Admitting: Internal Medicine

## 2018-12-11 NOTE — Progress Notes (Signed)
Virtual Visit via Video Note The purpose of this virtual visit is to provide medical care while limiting exposure to the novel coronavirus.    Consent was obtained for video visit:  Yes.   Answered questions that patient had about telehealth interaction:  Yes.   I discussed the limitations, risks, security and privacy concerns of performing an evaluation and management service by telemedicine. I also discussed with the patient that there may be a patient responsible charge related to this service. The patient expressed understanding and agreed to proceed.  Pt location: Home Physician Location: office Name of referring provider:  Golding, John, MD I connected with Jacqueline [name] at patients initiation/request on 12/14/2018 at  2:30 PM EST by video enabled telemedicine application and verified that I am speaking with the correct person using two identifiers. Pt MRN:  [id number] Pt DOB:  May 21, 1975 Video Participants:  Jacqueline [name]   History of Present Illness:  Jacqueline [name] is a 43 year old right handed Caucasian female who follows up for left facial dysesthesias and ocular migraines.  UPDATE: 08/10/2018:  Lyme IgG/IgM and quant IgM antibodies negative; ACE 22 09/11/2018 MRI FACE/Left Trigeminal Nerve Wo W:  Normal trigeminal nerve protocol.  She still has a burning tingling sensation over the left side of upper lip, left side of tongue, cheek and above the jaw.  It comes and goes.  In September it lasted 3 weeks.  Now, they are lasting a day to a few days and may occur every 7 days or so.  Ice packs help.  It may be more noticeable at night.    HISTORY: She was evaluated by neurology in 2015 for fatigue, mental fog, intermittent fuzzy vision as well as fainting spells and episodes of numbness and tingling sensation.  She had an MRI of the brain with and without contrast on 05/10/13 which was personally reviewed and unremarkable except for incidental posterior fossa arachnoid  cyst.  Labs, such as TSH, B12, ANA, lupus were negative.    In January 2020, she had a respiratory illness.  In early February, she started having significant left sided facial aching pain involving the cheek and jaw, associated with burning and tingling sensation in the left V2-V3 distribution including left side of tongue.  It started after a dental procedure so the dentist performed a panoramic X-ray which was unremarkable.  Around the same time, she reports increased emotional stress as well.  She went to her PCP who diagnosed her with shingles and treated with valacyclovir and prednisone and symptoms resolved.  Then in early April, she had a recurrence of the left sided facial paresthesias and burning but not the aching pain.  For a short time, she thought she noted mild symptoms involving the right side, but that didn't last too long.  No associated facial weakness.  Gabapentin was ineffective and caused side effects.  MRI of brain without contrast from 04/20/18 was personally reviewed and demonstrated an incidental posterior fossa arachnoid cyst, which appears stable compared to prior MRI of brain from 2015.  B12 in May was in the 200s and she was started on supplement.  Vitamin D was low, so she was started on supplement as well.  Thyroid testing reportedly unremarkable. Reportedly no evidence of diabetes.  She has longstanding history of multiple chronic symptoms.  In 2011, she started having extreme fatigue and muscle weakness.  Since childhood, she has had recurrent episodes of fainting.  Last spell was in 2014.  TTE  at that time showed EF 55-60%.  She was told it was due to low blood pressure.  Questionable autonomic dysfunction, such as POTs.  For many years, she has suffered from fatigue, muscle weakness and mental fog, occasional numbness and tingling in her arms.    She also has episodes of visual disturbance, described as a blurry floater that moves into her vision from the left, lasting  20 minutes and resolves.  No associated headache.  She has some mild headaches around her cycle.  Family History: Mother:  Seizure disorder; MI, CVA/TIA, hypothyroidism, headaches.  Interestingly, she has classic trigeminal neuralgia. Father:  CAD, hypothyroidism  Past Medical History: Past Medical History:  Diagnosis Date  . Tachycardia     Medications: Outpatient Encounter Medications as of 12/14/2018  Medication Sig  . ibuprofen (ADVIL) 200 MG tablet Take 200 mg by mouth every 8 (eight) hours as needed for moderate pain.  Marland Kitchen OVER THE COUNTER MEDICATION Organic Smooth Move - Peppermint tea - Senna Stimulant Laxative - Patient states that she drinks 1 time per week or 1 time every other week.  . polyethylene glycol (MIRALAX / GLYCOLAX) packet Take 17 g by mouth daily.  . vitamin B-12 (CYANOCOBALAMIN) 1000 MCG tablet Take 1,000 mcg by mouth daily.   No facility-administered encounter medications on file as of 12/14/2018.     Allergies: Allergies  Allergen Reactions  . Ivp Dye [Iodinated Diagnostic Agents] Nausea And Vomiting  . Prednisone Other (See Comments)    Behavior changes     Family History: No family history on file.  Social History: Social History   Socioeconomic History  . Marital status: Married    Spouse name: Deniece Portela  . Number of children: 0  . Years of education: 67  . Highest education level: Associate degree: occupational, Scientist, product/process development, or vocational program  Occupational History    Employer: [employer]  Social Needs  . Financial resource strain: Not on file  . Food insecurity    Worry: Not on file    Inability: Not on file  . Transportation needs    Medical: Not on file    Non-medical: Not on file  Tobacco Use  . Smoking status: Never Smoker  . Smokeless tobacco: Never Used  Substance and Sexual Activity  . Alcohol use: No    Alcohol/week: 0.0 standard drinks  . Drug use: No  . Sexual activity: Yes    Birth control/protection: None   Lifestyle  . Physical activity    Days per week: Not on file    Minutes per session: Not on file  . Stress: Not on file  Relationships  . Social Musician on phone: Not on file    Gets together: Not on file    Attends religious service: Not on file    Active member of club or organization: Not on file    Attends meetings of clubs or organizations: Not on file    Relationship status: Not on file  . Intimate partner violence    Fear of current or ex partner: Not on file    Emotionally abused: Not on file    Physically abused: Not on file    Forced sexual activity: Not on file  Other Topics Concern  . Not on file  Social History Narrative   Patient is right-handed. She lives with her husband in a 2 level home. She walks on her treadmill, trails near her home and with her dogs daily.  Observations/Objective:   Height 4\' 11"  (1.499 m), weight 100 lb (45.4 kg). No acute distress.  Alert and oriented.  Speech fluent and not dysarthric.  Language intact.  Eyes orthophoric on primary gaze.  Face symmetric.  Assessment and Plan:   1.  Atypical left sided V2-V3 trigeminal neuralgia, likely postviral from previous URI or herpetic.  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  No evidence of demyelinating disease involving the brain, no abnormality on imaging of the trigeminal nerve. 2.  Ocular migraines 3.  Various other chronic somatic symptoms, which I suspect are unrelated.  1. Start baclofen 5mg  three times daily.  She would rather avoid antidepressant or antiepileptic medications at this time.  We can increase dose or change medication if needed. 2.  Follow up in 4 months.  Follow Up Instructions:    -I discussed the assessment and treatment plan with the patient. The patient was provided an opportunity to ask questions and all were answered. The patient agreed with the plan and demonstrated an understanding of the instructions.   The patient was advised to  call back or seek an in-person evaluation if the symptoms worsen or if the condition fails to improve as anticipated.    Total Time spent in visit with the patient was:  18 minutes  Dudley Major, DO

## 2018-12-14 ENCOUNTER — Other Ambulatory Visit: Payer: Self-pay

## 2018-12-14 ENCOUNTER — Telehealth (INDEPENDENT_AMBULATORY_CARE_PROVIDER_SITE_OTHER): Payer: Self-pay | Admitting: Neurology

## 2018-12-14 ENCOUNTER — Encounter: Payer: Self-pay | Admitting: Neurology

## 2018-12-14 VITALS — Ht 59.0 in | Wt 100.0 lb

## 2018-12-14 DIAGNOSIS — G43909 Migraine, unspecified, not intractable, without status migrainosus: Secondary | ICD-10-CM

## 2018-12-14 DIAGNOSIS — G501 Atypical facial pain: Secondary | ICD-10-CM

## 2018-12-14 DIAGNOSIS — F459 Somatoform disorder, unspecified: Secondary | ICD-10-CM

## 2018-12-14 MED ORDER — BACLOFEN 5 MG PO TABS: 5.0000 mg | ORAL_TABLET | Freq: Three times a day (TID) | ORAL | 4 refills | Status: DC

## 2019-05-03 ENCOUNTER — Telehealth (INDEPENDENT_AMBULATORY_CARE_PROVIDER_SITE_OTHER): Payer: Self-pay | Admitting: Neurology

## 2019-05-03 ENCOUNTER — Other Ambulatory Visit: Payer: Self-pay

## 2019-05-03 ENCOUNTER — Encounter: Payer: Self-pay | Admitting: Neurology

## 2019-05-03 VITALS — Ht 59.5 in | Wt 100.0 lb

## 2019-05-03 DIAGNOSIS — G43B0 Ophthalmoplegic migraine, not intractable: Secondary | ICD-10-CM

## 2019-05-03 DIAGNOSIS — G501 Atypical facial pain: Secondary | ICD-10-CM

## 2019-05-03 MED ORDER — BACLOFEN 5 MG PO TABS: 5.0000 mg | ORAL_TABLET | Freq: Three times a day (TID) | ORAL | 4 refills | Status: AC

## 2019-05-03 NOTE — Progress Notes (Signed)
Virtual Visit via Video Note The purpose of this virtual visit is to provide medical care while limiting exposure to the novel coronavirus.    Consent was obtained for video visit:  Yes.   Answered questions that patient had about telehealth interaction:  Yes.   I discussed the limitations, risks, security and privacy concerns of performing an evaluation and management service by telemedicine. I also discussed with the patient that there may be a patient responsible charge related to this service. The patient expressed understanding and agreed to proceed.  Pt location: Home Physician Location: office Name of referring provider:  Assunta Found, MD I connected with Chandra Batch at patients initiation/request on 05/03/2019 at  3:30 PM EDT by video enabled telemedicine application and verified that I am speaking with the correct person using two identifiers. Pt MRN:  [id number] Pt DOB:  July 25, 1975 Video Participants:  Chandra Batch   History of Present Illness:  Jacqueline [name] is a 44 year old right handed Caucasian female who follows up for left facial dysesthesias and ocular migraines.  UPDATE: Current medications:  Baclofen 5mg  three times daily PRN.  She is doing better.  Facial dysesthesias occurs once in a while but are much less frequent.  If she takes baclofen, it calms it down.  It does not interfere with her daily activities or quality of life.  HISTORY: She was evaluated by neurology in 2015 for fatigue, mental fog, intermittent fuzzy vision as well as fainting spells and episodes of numbness and tingling sensation. She had an MRI of the brain with and without contrast on 05/10/13 was unremarkable except for incidental posterior fossa arachnoid cyst. Labs, such as TSH, B12, ANA, lupus were negative.   In January 2020, she had a respiratory illness.InearlyFebruary 2020, she started having significant left sided facial aching pain involving the cheek and jaw,  associated with burning and tingling sensation in the left V2-V3 distribution including left side of tongue. It started after a dental procedure so the dentist performed a panoramic X-ray which was unremarkable. Around the same time, she reports increased emotional stress as well. She went to her PCP who diagnosedherwith shinglesand treated with valacyclovir and prednisone and symptoms resolved. Then inearly April,she had a recurrence of the left sided facial paresthesias and burning but not the aching pain. For a short time, she thought she noted mild symptoms involving the right side, but that didn't last too long. No associated facial weakness.  Gabapentin was ineffective and caused side effects.  04/20/2018 MRI BRAIN WO:  incidental posterior fossa arachnoid cyst, which appears stable compared to prior MRI of brain from 2015. B12 in May was in the 200s and she was started on supplement. Vitamin D was low, so she was started on supplement as well. Thyroid testing reportedly unremarkable. Reportedly no evidence of diabetes. 08/10/2018:  Lyme IgG/IgM and quant IgM antibodies negative; ACE 22 09/11/2018 MRI FACE/Left Trigeminal Nerve Wo W:  Normal trigeminal nerve protocol.  She has longstanding history of multiple chronic symptoms.In 2011, she started having extreme fatigue and muscle weakness. Since childhood, she has had recurrent episodes of fainting. Last spell was in 2014. TTE at that time showed EF 55-60%. She was told it was due to low blood pressure. Questionable autonomic dysfunction, such as POTs. For many years, she has suffered from fatigue, muscle weakness and mental fog, occasional numbness and tingling in her arms.   She also has episodes of visual disturbance, described as a blurry floater that  moves into her vision from the left, lasting 20 minutes and resolves. No associated headache. She has some mild headaches around her cycle.  Family History: Mother:  Seizure disorder; MI, CVA/TIA, hypothyroidism, headaches.  Interestingly, she has classic trigeminal neuralgia. Father: CAD, hypothyroidism  Past Medical History: Past Medical History:  Diagnosis Date  . Tachycardia     Medications: Outpatient Encounter Medications as of 05/03/2019  Medication Sig  . Ascorbic Acid (VITAMIN C) 100 MG tablet Vitamin C  . Baclofen 5 MG TABS Take 5 mg by mouth 3 (three) times daily.  . cholecalciferol (VITAMIN D3) 25 MCG (1000 UT) tablet ergocalciferol (vitamin D2) 1,250 mcg (50,000 unit) capsule  . OVER THE COUNTER MEDICATION Organic Smooth Move - Peppermint tea - Senna Stimulant Laxative - Patient states that she drinks 1 time per week or 1 time every other week.   No facility-administered encounter medications on file as of 05/03/2019.    Allergies: Allergies  Allergen Reactions  . Ivp Dye [Iodinated Diagnostic Agents] Nausea And Vomiting  . Prednisone Other (See Comments)    Behavior changes     Family History: No family history on file.  Social History: Social History   Socioeconomic History  . Marital status: Married    Spouse name: [name]  . Number of children: 0  . Years of education: 58  . Highest education level: High school graduate  Occupational History    Employer: [employer]  Tobacco Use  . Smoking status: Never Smoker  . Smokeless tobacco: Never Used  Substance and Sexual Activity  . Alcohol use: No    Alcohol/week: 0.0 standard drinks  . Drug use: No  . Sexual activity: Yes    Birth control/protection: None  Other Topics Concern  . Not on file  Social History Narrative   Patient is right-handed. She lives with her husband in a 2 level home. She walks on her treadmill, trails near her home and with her dogs daily.   Social Determinants of Health   Financial Resource Strain:   . Difficulty of Paying Living Expenses:   Food Insecurity:   . Worried About Charity fundraiser in the Last Year:   . Academic librarian in the Last Year:   Transportation Needs:   . Film/video editor (Medical):   Marland Kitchen Lack of Transportation (Non-Medical):   Physical Activity:   . Days of Exercise per Week:   . Minutes of Exercise per Session:   Stress:   . Feeling of Stress :   Social Connections:   . Frequency of Communication with Friends and Family:   . Frequency of Social Gatherings with Friends and Family:   . Attends Religious Services:   . Active Member of Clubs or Organizations:   . Attends Archivist Meetings:   Marland Kitchen Marital Status:   Intimate Partner Violence:   . Fear of Current or Ex-Partner:   . Emotionally Abused:   Marland Kitchen Physically Abused:   . Sexually Abused:     Observations/Objective:   Height 4' 11.5" (1.511 m), weight 100 lb (45.4 kg). No acute distress.  Alert and oriented.  Speech fluent and not dysarthric.  Language intact.  Eyes orthophoric on primary gaze.  Face symmetric.  Assessment and Plan:   1.  Atypical left sided V2-V3 trigeminal neuralgia, likely post-viral from previous URI or herpetic.  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 No evidence of demyelinating disease involving the brain, no abnormality on imaging  of the trigeminal nerve. 2.  Ocular migraines. 3.  Various other chronic somatic symptoms, which I suspect are unrelated.  1.  Baclofen 5mg  TID PRN. Refill today 2.  Follow up in 4 months.  Follow Up Instructions:    -I discussed the assessment and treatment plan with the patient. The patient was provided an opportunity to ask questions and all were answered. The patient agreed with the plan and demonstrated an understanding of the instructions.   The patient was advised to call back or seek an in-person evaluation if the symptoms worsen or if the condition fails to improve as anticipated.   , DO

## 2019-11-08 ENCOUNTER — Ambulatory Visit: Payer: Self-pay | Admitting: Neurology

## 2020-04-28 NOTE — Progress Notes (Signed)
Virtual Visit via Video Note The purpose of this virtual visit is to provide medical care while limiting exposure to the novel coronavirus.    Consent was obtained for video visit:  yes Answered questions that patient had about telehealth interaction:  yes I discussed the limitations, risks, security and privacy concerns of performing an evaluation and management service by telemedicine. I also discussed with the patient that there may be a patient responsible charge related to this service. The patient expressed understanding and agreed to proceed.  Pt location: Home Physician Location: office Name of referring provider:  Assunta Found, MD I connected with Jacqueline [name] at patients initiation/request on 05/01/2020 at 10:50 AM EDT by video enabled telemedicine application and verified that I am speaking with the correct person using two identifiers. Pt MRN:  [id number] Pt DOB:  1975/11/30 Video Participants:  Jacqueline [name]  Assessment and Plan:   1.  Atypical left sided V2-V3 trigeminal neuralgia, likely post-viral from previous URI or herpetic.  Unlikely related to the posterior fossa arachnoid cyst.No evidence of demyelinating disease involving the brain, no abnormality on imaging of the trigeminal nerve.  Stable 2.  Ocular migraines, stable 3.  Various other chronic somatic symptoms, which I suspect are unrelated.  1.  Baclofen and ice pack as needed 2.  Follow up 6 months.  History of Present Illness:  Jacqueline [name] is a 45 year old right handed Caucasian female whofollows up for left facial dysesthesias and ocular migraines.  UPDATE: Current medications:  Baclofen 5mg  three times daily PRN.  May have burning/tingling once in awhile.  Not too bad.  Ice packs help.  Ocular migraines have been stable.  HISTORY: She was evaluated by neurology in 2015 for fatigue, mental fog, intermittent fuzzy vision as well as fainting spells and episodes of numbness and  tingling sensation. She had an MRI of the brain with and without contrast on 05/10/13 was unremarkable except for incidental posterior fossa arachnoid cyst. Labs, such as TSH, B12, ANA, lupus were negative.   In January2020, she had a respiratory illness.InearlyFebruary 2020, she started having significant left sided facial aching pain involving the cheek and jaw, associated with burning and tingling sensation in the left V2-V3 distribution including left side of tongue. It started after a dental procedure so the dentist performed a panoramic X-ray which was unremarkable. Around the same time, she reports increased emotional stress as well. She went to her PCP who diagnosedherwith shinglesand treated with valacyclovir and prednisone and symptoms resolved. Then inearly April,she had a recurrence of the left sided facial paresthesias and burning but not the aching pain. For a short time, she thought she noted mild symptoms involving the right side, but that didn't last too long. No associated facial weakness.Gabapentin was ineffective and caused side effects.  04/20/2018 MRI BRAIN WO:  incidental posterior fossa arachnoid cyst, which appears stable compared to prior MRI of brain from 2015. B12 in May was in the 200s and she was started on supplement. Vitamin D was low, so she was started on supplement as well. Thyroid testing reportedly unremarkable. Reportedly no evidence of diabetes. 08/10/2018: Lyme IgG/IgM and quant IgM antibodies negative; ACE 22 09/11/2018 MRI FACE/Left Trigeminal Nerve Wo W: Normal trigeminal nerve protocol.  She has longstanding history of multiple chronic symptoms.In 2011, she started having extreme fatigue and muscle weakness. Since childhood, she has had recurrent episodes of fainting. Last spell was in 2014. TTE at that time showed EF 55-60%. She was told  it was due to low blood pressure. Questionable autonomic dysfunction, such as POTs. For many  years, she has suffered from fatigue, muscle weakness and mental fog, occasional numbness and tingling in her arms.   She also has episodes of visual disturbance, described as a blurry floater that moves into her vision from the left, lasting 20 minutes and resolves. No associated headache. She has some mild headaches around her cycle.  Family History: Mother: Seizure disorder; MI, CVA/TIA, hypothyroidism, headaches. Interestingly, she has classic trigeminal neuralgia. Father: CAD, hypothyroidism  Past medications:  Gabapentin (side effects)   Past Medical History: Past Medical History:  Diagnosis Date  . Tachycardia     Medications: Outpatient Encounter Medications as of 05/01/2020  Medication Sig  . Ascorbic Acid (VITAMIN C) 100 MG tablet Vitamin C  . Baclofen 5 MG TABS Take 5 mg by mouth 3 (three) times daily.  . cholecalciferol (VITAMIN D3) 25 MCG (1000 UT) tablet ergocalciferol (vitamin D2) 1,250 mcg (50,000 unit) capsule  . ergocalciferol (VITAMIN D2) 1.25 MG (50000 UT) capsule ergocalciferol (vitamin D2) 1,250 mcg (50,000 unit) capsule  . OVER THE COUNTER MEDICATION Organic Smooth Move - Peppermint tea - Senna Stimulant Laxative - Patient states that she drinks 1 time per week or 1 time every other week.   No facility-administered encounter medications on file as of 05/01/2020.    Allergies: Allergies  Allergen Reactions  . Ivp Dye [Iodinated Diagnostic Agents] Nausea And Vomiting  . Prednisone Other (See Comments)    Behavior changes     Family History: No family history on file.  Observations/Objective:   Height 4\' 11"  (1.499 m), weight 100 lb (45.4 kg). No acute distress.  Alert and oriented.  Speech fluent and not dysarthric.  Language intact.     Follow Up Instructions:    -I discussed the assessment and treatment plan with the patient. The patient was provided an opportunity to ask questions and all were answered. The patient agreed with the plan and  demonstrated an understanding of the instructions.   The patient was advised to call back or seek an in-person evaluation if the symptoms worsen or if the condition fails to improve as anticipated.   , DO

## 2020-05-01 ENCOUNTER — Other Ambulatory Visit: Payer: Self-pay

## 2020-05-01 ENCOUNTER — Telehealth (INDEPENDENT_AMBULATORY_CARE_PROVIDER_SITE_OTHER): Payer: Self-pay | Admitting: Neurology

## 2020-05-01 ENCOUNTER — Encounter: Payer: Self-pay | Admitting: Neurology

## 2020-05-01 VITALS — Ht 59.0 in | Wt 100.0 lb

## 2020-05-01 DIAGNOSIS — G501 Atypical facial pain: Secondary | ICD-10-CM

## 2020-05-01 DIAGNOSIS — G43109 Migraine with aura, not intractable, without status migrainosus: Secondary | ICD-10-CM

## 2020-11-03 NOTE — Progress Notes (Signed)
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  No evidence of demyelinating disease involving the brain, no abnormality on imaging of the trigeminal nerve.  Stable Ocular migraine, stable  1  Baclofen 5mg  three times daily PRN and ice pack as needed 2   Follow up one year  Subjective:  Jacqueline [name] is a 45 year old right handed Caucasian female who follows up for left atypical facial pain and ocular migraines.   UPDATE: Current medications:  Baclofen 5mg  three times daily PRN.   May have it around menses but mild.  Not too bad.  Ice packs help. She has baclofen but hasn't needed to take it.   Ocular migraines have been stable.   HISTORY: She was evaluated by neurology in 2015 for fatigue, mental fog, intermittent fuzzy vision as well as fainting spells and episodes of numbness and tingling sensation.  She had an MRI of the brain with and without contrast on 05/10/13 was unremarkable except for incidental posterior fossa arachnoid cyst.  Labs, such as TSH, B12, ANA, lupus were negative.     In January 2020, she had a respiratory illness.  In early February 2020, she started having significant left sided facial aching pain involving the cheek and jaw, associated with burning and tingling sensation in the left V2-V3 distribution including left side of tongue.  It started after a dental procedure so the dentist performed a panoramic X-ray which was unremarkable.  Around the same time, she reports increased emotional stress as well.  She went to her PCP who diagnosed her with shingles and treated with valacyclovir and prednisone and symptoms resolved.  Then in early April, she had a recurrence of the left sided facial paresthesias and burning but not the aching pain.  For a short time, she thought she  noted mild symptoms involving the right side, but that didn't last too long.  No associated facial weakness.  Gabapentin was ineffective and caused side effects.   04/20/2018 MRI BRAIN WO:  incidental posterior fossa arachnoid cyst, which appears stable compared to prior MRI of brain from 2015.  B12 in May was in the 200s and she was started on supplement.  Vitamin D was low, so she was started on supplement as well.  Thyroid testing reportedly unremarkable. Reportedly no evidence of diabetes. 08/10/2018:  Lyme IgG/IgM and quant IgM antibodies negative; ACE 22 09/11/2018 MRI FACE/Left Trigeminal Nerve Wo W:  Normal trigeminal nerve protocol.   She has longstanding history of multiple chronic symptoms.  In 2011, she started having extreme fatigue and muscle weakness.  Since childhood, she has had recurrent episodes of fainting.  Last spell was in 2014.  TTE at that time showed EF 55-60%.  She was told it was due to low blood pressure.  Questionable autonomic dysfunction, such as POTs.  For many years, she has suffered from fatigue, muscle weakness and mental fog, occasional numbness and tingling in her arms.     She also has episodes of visual disturbance, described as a blurry floater that moves into her vision from the left, lasting 20 minutes and resolves.  No associated headache.  She has some mild headaches around her cycle.   Family History: Mother:  Seizure disorder; MI, CVA/TIA, hypothyroidism, headaches.  Interestingly, she has classic trigeminal neuralgia. Father:  CAD, hypothyroidism   Past  medications:  Gabapentin (side effects)  PAST MEDICAL HISTORY: Past Medical History:  Diagnosis Date   Tachycardia     MEDICATIONS: Current Outpatient Medications on File Prior to Visit  Medication Sig Dispense Refill   Ascorbic Acid (VITAMIN C) 100 MG tablet Vitamin C     azithromycin (ZITHROMAX) 250 MG tablet Take by mouth.     Baclofen 5 MG TABS Take 5 mg by mouth 3 (three) times daily. 90  tablet 4   cholecalciferol (VITAMIN D3) 25 MCG (1000 UT) tablet ergocalciferol (vitamin D2) 1,250 mcg (50,000 unit) capsule     ergocalciferol (VITAMIN D2) 1.25 MG (50000 UT) capsule ergocalciferol (vitamin D2) 1,250 mcg (50,000 unit) capsule     OVER THE COUNTER MEDICATION Organic Smooth Move - Peppermint tea - Senna Stimulant Laxative - Patient states that she drinks 1 time per week or 1 time every other week.     No current facility-administered medications on file prior to visit.    ALLERGIES: Allergies  Allergen Reactions   Ivp Dye [Iodinated Diagnostic Agents] Nausea And Vomiting   Prednisone Other (See Comments)    Behavior changes     FAMILY HISTORY: No family history on file.    Objective:  Blood pressure 113/67, pulse 90, height 4' 11.5" (1.511 m), weight 110 lb (49.9 kg), SpO2 99 %. General: No acute distress.  Patient appears well-groomed.   Head:  Normocephalic/atraumatic Eyes:  Fundi examined but not visualized Neck: supple, no paraspinal tenderness, full range of motion Heart:  Regular rate and rhythm Lungs:  Clear to auscultation bilaterally Back: No paraspinal tenderness Neurological Exam: alert and oriented to person, place, and time.  Speech fluent and not dysarthric, language intact.  CN II-XII intact. Bulk and tone normal, muscle strength 5/5 throughout.  Sensation to light touch intact.  Deep tendon reflexes 2+ throughout.  Finger to nose testing intact.  Gait normal, Romberg negative.   Shon Millet, DO  CC: Assunta Found, MD

## 2020-11-06 ENCOUNTER — Other Ambulatory Visit: Payer: Self-pay

## 2020-11-06 ENCOUNTER — Encounter: Payer: Self-pay | Admitting: Neurology

## 2020-11-06 ENCOUNTER — Ambulatory Visit (INDEPENDENT_AMBULATORY_CARE_PROVIDER_SITE_OTHER): Payer: Self-pay | Admitting: Neurology

## 2020-11-06 VITALS — BP 113/67 | HR 90 | Ht 59.5 in | Wt 110.0 lb

## 2020-11-06 DIAGNOSIS — G43109 Migraine with aura, not intractable, without status migrainosus: Secondary | ICD-10-CM

## 2020-11-06 DIAGNOSIS — G501 Atypical facial pain: Secondary | ICD-10-CM

## 2020-11-06 NOTE — Patient Instructions (Signed)
Continue using ice pack if needed.  You can use baclofen if needed

## 2021-11-07 ENCOUNTER — Ambulatory Visit: Payer: Self-pay | Admitting: Neurology

## 2022-03-08 NOTE — Progress Notes (Unsigned)
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  Ocular migraine, stable  1  Baclofen '5mg'$  three times daily PRN and ice pack as needed  2   Follow up one year   Subjective:  Jacqueline [name] is a 47 year old right handed Caucasian female who follows up for left atypical facial pain and ocular migraines.   UPDATE: Current medications:  Baclofen '5mg'$  three times daily PRN.   Tingling once in awhile - not needed baclofen.  Flares up around her cycle sometimes.  The dentist thinks she may have some TMJ dysfunction as well.  Ocular migraines are also infrequent.  Lasts 20 minutes.   HISTORY: She was evaluated by neurology in 2015 for fatigue, mental fog, intermittent fuzzy vision as well as fainting spells and episodes of numbness and tingling sensation.  She had an MRI of the brain with and without contrast on 05/10/13 was unremarkable except for incidental posterior fossa arachnoid cyst.  Labs, such as TSH, B12, ANA, lupus were negative.     In January 2020, she had a respiratory illness.  In early February 2020, she started having significant left sided facial aching pain involving the cheek and jaw, associated with burning and tingling sensation in the left V2-V3 distribution including left side of tongue.  It started after a dental procedure so the dentist performed a panoramic X-ray which was unremarkable.  Around the same time, she reports increased emotional stress as well.  She went to her PCP who diagnosed her with shingles and treated with valacyclovir and prednisone and symptoms resolved.  Then in early April, she had a recurrence of the left sided facial paresthesias and burning but not the aching pain.  For a short time, she thought she noted mild symptoms involving the right side, but that didn't last too long.  No associated facial weakness.   Gabapentin was ineffective and caused side effects.   04/20/2018 MRI BRAIN WO:  incidental posterior fossa arachnoid cyst, which appears stable compared to prior MRI of brain from 2015.  B12 in May was in the 200s and she was started on supplement.  Vitamin D was low, so she was started on supplement as well.  Thyroid testing reportedly unremarkable. Reportedly no evidence of diabetes. 08/10/2018:  Lyme IgG/IgM and quant IgM antibodies negative; ACE 22 09/11/2018 MRI FACE/Left Trigeminal Nerve Wo W:  Normal trigeminal nerve protocol.   She has longstanding history of multiple chronic symptoms.  In 2011, she started having extreme fatigue and muscle weakness.  Since childhood, she has had recurrent episodes of fainting.  Last spell was in 2014.  TTE at that time showed EF 55-60%.  She was told it was due to low blood pressure.  Questionable autonomic dysfunction, such as POTs.  For many years, she has suffered from fatigue, muscle weakness and mental fog, occasional numbness and tingling in her arms.     She also has episodes of visual disturbance, described as a blurry floater that moves into her vision from the left, lasting 20 minutes and resolves.  No associated headache.  She has some mild headaches around her cycle.   Family History: Mother:  Seizure disorder; MI, CVA/TIA, hypothyroidism, headaches.  Interestingly, she has classic trigeminal neuralgia. Father:  CAD, hypothyroidism   Past medications:  Gabapentin (side effects)  PAST MEDICAL HISTORY: Past Medical History:  Diagnosis Date   Tachycardia  MEDICATIONS: Current Outpatient Medications on File Prior to Visit  Medication Sig Dispense Refill   Ascorbic Acid (VITAMIN C) 100 MG tablet 1,000 mg.     Baclofen 5 MG TABS Take 5 mg by mouth 3 (three) times daily. 90 tablet 4   OVER THE COUNTER MEDICATION Organic Smooth Move - Peppermint tea - Senna Stimulant Laxative - Patient states that she drinks 1 time per week or 1 time every  other week.     No current facility-administered medications on file prior to visit.    ALLERGIES: Allergies  Allergen Reactions   Ivp Dye [Iodinated Contrast Media] Nausea And Vomiting   Prednisone Other (See Comments)    Behavior changes     FAMILY HISTORY: No family history on file.    Objective:  Blood pressure 122/72, pulse 86, height '4\' 11"'$  (1.499 m), weight 107 lb 12.8 oz (48.9 kg), SpO2 98 %. General: No acute distress.  Patient appears well-groomed.   Head:  Normocephalic/atraumatic Eyes:  Fundi examined but not visualized Neck: supple, no paraspinal tenderness, full range of motion Heart:  Regular rate and rhythm Neurological Exam: alert and oriented to person, place, and time.  Speech fluent and not dysarthric, language intact.  CN II-XII intact. Bulk and tone normal, muscle strength 5/5 throughout.  Sensation to light touch intact.  Deep tendon reflexes 2+ throughout.  Finger to nose testing intact.  Gait normal, Romberg negative.   Metta Clines, DO

## 2022-03-11 ENCOUNTER — Ambulatory Visit (INDEPENDENT_AMBULATORY_CARE_PROVIDER_SITE_OTHER): Payer: Self-pay | Admitting: Neurology

## 2022-03-11 ENCOUNTER — Encounter: Payer: Self-pay | Admitting: Neurology

## 2022-03-11 VITALS — BP 122/72 | HR 86 | Ht 59.0 in | Wt 107.8 lb

## 2022-03-11 DIAGNOSIS — G43109 Migraine with aura, not intractable, without status migrainosus: Secondary | ICD-10-CM

## 2022-03-11 DIAGNOSIS — G500 Trigeminal neuralgia: Secondary | ICD-10-CM

## 2022-03-11 NOTE — Patient Instructions (Signed)
Help finding a primary care provider within [place]. If you do not have access to a computer or the Internet you can call [phone] and  they will help you scheduled with a provider.  Or you can go to: [email or web address]

## 2022-12-30 ENCOUNTER — Encounter (INDEPENDENT_AMBULATORY_CARE_PROVIDER_SITE_OTHER): Payer: Self-pay | Admitting: *Deleted

## 2023-03-10 ENCOUNTER — Ambulatory Visit: Payer: Self-pay | Admitting: Neurology

## 2023-06-24 NOTE — Progress Notes (Unsigned)
 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  Ocular migraine, stable  1  Baclofen  5mg  three times daily PRN and ice pack as needed  2   Follow up one year   Subjective:  Jacqueline [name] is a 48 year old right handed Caucasian female who follows up for left atypical facial pain and ocular migraines.   UPDATE: Current medications:  Baclofen  5mg  three times daily PRN.   Tingling once in awhile - not needed baclofen .  Flares up around her cycle sometimes.  The dentist thinks she may have some TMJ dysfunction as well.  Ocular migraines are also infrequent.  Lasts 20 minutes.   HISTORY: She was evaluated by neurology in 2015 for fatigue, mental fog, intermittent fuzzy vision as well as fainting spells and episodes of numbness and tingling sensation.  She had an MRI of the brain with and without contrast on 05/10/13 was unremarkable except for incidental posterior fossa arachnoid cyst.  Labs, such as TSH, B12, ANA, lupus were negative.     In January 2020, she had a respiratory illness.  In early February 2020, she started having significant left sided facial aching pain involving the cheek and jaw, associated with burning and tingling sensation in the left V2-V3 distribution including left side of tongue.  It started after a dental procedure so the dentist performed a panoramic X-ray which was unremarkable.  Around the same time, she reports increased emotional stress as well.  She went to her PCP who diagnosed her with shingles and treated with valacyclovir and prednisone and symptoms resolved.  Then in early April, she had a recurrence of the left sided facial paresthesias and burning but not the aching pain.  For a short time, she thought she noted mild symptoms involving the right side, but that didn't last too long.  No associated facial weakness.   Gabapentin was ineffective and caused side effects.   04/20/2018 MRI BRAIN WO:  incidental posterior fossa arachnoid cyst, which appears stable compared to prior MRI of brain from 2015.  B12 in May was in the 200s and she was started on supplement.  Vitamin D  was low, so she was started on supplement as well.  Thyroid testing reportedly unremarkable. Reportedly no evidence of diabetes. 08/10/2018:  Lyme IgG/IgM and quant IgM antibodies negative; ACE 22 09/11/2018 MRI FACE/Left Trigeminal Nerve Wo W:  Normal trigeminal nerve protocol.   She has longstanding history of multiple chronic symptoms.  In 2011, she started having extreme fatigue and muscle weakness.  Since childhood, she has had recurrent episodes of fainting.  Last spell was in 2014.  TTE at that time showed EF 55-60%.  She was told it was due to low blood pressure.  Questionable autonomic dysfunction, such as POTs.  For many years, she has suffered from fatigue, muscle weakness and mental fog, occasional numbness and tingling in her arms.     She also has episodes of visual disturbance, described as a blurry floater that moves into her vision from the left, lasting 20 minutes and resolves.  No associated headache.  She has some mild headaches around her cycle.   Family History: Mother:  Seizure disorder; MI, CVA/TIA, hypothyroidism, headaches.  Interestingly, she has classic trigeminal neuralgia. Father:  CAD, hypothyroidism   Past medications:  Gabapentin (side effects)  PAST MEDICAL HISTORY: Past Medical History:  Diagnosis Date   Tachycardia  MEDICATIONS: Current Outpatient Medications on File Prior to Visit  Medication Sig Dispense Refill   Ascorbic Acid (VITAMIN C) 100 MG tablet 1,000 mg.     Baclofen  5 MG TABS Take 5 mg by mouth 3 (three) times daily. 90 tablet 4   OVER THE COUNTER MEDICATION Organic Smooth Move - Peppermint tea - Senna Stimulant Laxative - Patient states that she drinks 1 time per week or 1 time every  other week.     No current facility-administered medications on file prior to visit.    ALLERGIES: Allergies  Allergen Reactions   Ivp Dye [Iodinated Contrast Media] Nausea And Vomiting   Prednisone Other (See Comments)    Behavior changes     FAMILY HISTORY: No family history on file.    Objective:  *** General: No acute distress.  Patient appears well-groomed.   Head:  Normocephalic/atraumatic Neck:  Supple.  No paraspinal tenderness.  Full range of motion. Heart:  Regular rate and rhythm. Neuro:  Alert and oriented.  Speech fluent and not dysarthric.  Language intact.  CN II-XII intact.  Bulk and tone normal.  Muscle strength 5/5 throughout.  Sensation to light touch intact.  Deep tendon reflexes 2+ throughout, toes downgoing.  Gait normal.  Romberg negative.    Janne Members, DO  CC:  Minus Amel, MD

## 2023-06-26 ENCOUNTER — Ambulatory Visit (INDEPENDENT_AMBULATORY_CARE_PROVIDER_SITE_OTHER): Payer: Self-pay | Admitting: Neurology

## 2023-06-26 ENCOUNTER — Encounter: Payer: Self-pay | Admitting: Neurology

## 2023-06-26 VITALS — BP 113/74 | HR 78 | Ht 59.0 in | Wt 107.0 lb

## 2023-06-26 DIAGNOSIS — G43109 Migraine with aura, not intractable, without status migrainosus: Secondary | ICD-10-CM

## 2023-06-26 DIAGNOSIS — G500 Trigeminal neuralgia: Secondary | ICD-10-CM

## 2023-06-26 NOTE — Patient Instructions (Addendum)
 Help finding a primary care provider within [place]. If you do not have access to a computer or the Internet you can call [phone] and  they will help you scheduled with a provider.  Or you can go to: [email or web address]    Baclofen  as needed.

## 2023-07-01 ENCOUNTER — Encounter (INDEPENDENT_AMBULATORY_CARE_PROVIDER_SITE_OTHER): Payer: Self-pay | Admitting: *Deleted

## 2024-06-28 ENCOUNTER — Ambulatory Visit: Payer: Self-pay | Admitting: Neurology
